# Patient Record
Sex: Female | Born: 1940 | Race: White | Hispanic: No | Smoking: Never smoker
Health system: Southern US, Community
[De-identification: ages and names within clinical notes are randomized; demographics above are authoritative.]

## PROBLEM LIST (undated history)

## (undated) DIAGNOSIS — R42 Dizziness and giddiness: Secondary | ICD-10-CM

## (undated) DIAGNOSIS — E039 Hypothyroidism, unspecified: Secondary | ICD-10-CM

## (undated) DIAGNOSIS — M1990 Unspecified osteoarthritis, unspecified site: Secondary | ICD-10-CM

## (undated) HISTORY — PX: EYE SURGERY: SHX253

## (undated) HISTORY — PX: TUBAL LIGATION: SHX77

## (undated) HISTORY — PX: CARPAL TUNNEL RELEASE: SHX101

## (undated) HISTORY — PX: HIP FRACTURE SURGERY: SHX118

---

## 1997-09-08 ENCOUNTER — Other Ambulatory Visit: Admission: RE | Admit: 1997-09-08 | Discharge: 1997-09-08 | Payer: Self-pay | Admitting: Gynecology

## 1997-09-17 ENCOUNTER — Ambulatory Visit (HOSPITAL_COMMUNITY): Admission: RE | Admit: 1997-09-17 | Discharge: 1997-09-17 | Payer: Self-pay | Admitting: *Deleted

## 1998-02-21 HISTORY — PX: BREAST BIOPSY: SHX20

## 1998-04-16 ENCOUNTER — Ambulatory Visit (HOSPITAL_COMMUNITY): Admission: RE | Admit: 1998-04-16 | Discharge: 1998-04-16 | Payer: Self-pay | Admitting: Diagnostic Radiology

## 1998-04-29 ENCOUNTER — Encounter: Payer: Self-pay | Admitting: Gynecology

## 1998-04-29 ENCOUNTER — Ambulatory Visit (HOSPITAL_COMMUNITY): Admission: RE | Admit: 1998-04-29 | Discharge: 1998-04-29 | Payer: Self-pay | Admitting: Gynecology

## 1998-04-29 HISTORY — PX: BREAST BIOPSY: SHX20

## 1998-11-30 ENCOUNTER — Ambulatory Visit (HOSPITAL_COMMUNITY): Admission: RE | Admit: 1998-11-30 | Discharge: 1998-11-30 | Payer: Self-pay | Admitting: Gynecology

## 1998-11-30 ENCOUNTER — Encounter: Payer: Self-pay | Admitting: Gynecology

## 1999-08-18 ENCOUNTER — Encounter: Payer: Self-pay | Admitting: *Deleted

## 1999-08-18 ENCOUNTER — Ambulatory Visit (HOSPITAL_COMMUNITY): Admission: RE | Admit: 1999-08-18 | Discharge: 1999-08-18 | Payer: Self-pay | Admitting: *Deleted

## 1999-10-18 ENCOUNTER — Ambulatory Visit (HOSPITAL_COMMUNITY): Admission: RE | Admit: 1999-10-18 | Discharge: 1999-10-18 | Payer: Self-pay | Admitting: Neurosurgery

## 2000-01-05 ENCOUNTER — Ambulatory Visit (HOSPITAL_COMMUNITY): Admission: RE | Admit: 2000-01-05 | Discharge: 2000-01-05 | Payer: Self-pay | Admitting: Gynecology

## 2000-01-05 ENCOUNTER — Encounter: Payer: Self-pay | Admitting: Gynecology

## 2001-01-09 ENCOUNTER — Ambulatory Visit (HOSPITAL_COMMUNITY): Admission: RE | Admit: 2001-01-09 | Discharge: 2001-01-09 | Payer: Self-pay | Admitting: Gynecology

## 2001-01-09 ENCOUNTER — Encounter: Payer: Self-pay | Admitting: Gynecology

## 2001-07-31 ENCOUNTER — Other Ambulatory Visit: Admission: RE | Admit: 2001-07-31 | Discharge: 2001-07-31 | Payer: Self-pay | Admitting: Obstetrics & Gynecology

## 2002-01-10 ENCOUNTER — Encounter: Payer: Self-pay | Admitting: Gynecology

## 2002-01-10 ENCOUNTER — Ambulatory Visit (HOSPITAL_COMMUNITY): Admission: RE | Admit: 2002-01-10 | Discharge: 2002-01-10 | Payer: Self-pay | Admitting: Gynecology

## 2002-10-01 ENCOUNTER — Other Ambulatory Visit: Admission: RE | Admit: 2002-10-01 | Discharge: 2002-10-01 | Payer: Self-pay | Admitting: Gynecology

## 2003-01-13 ENCOUNTER — Ambulatory Visit (HOSPITAL_COMMUNITY): Admission: RE | Admit: 2003-01-13 | Discharge: 2003-01-13 | Payer: Self-pay | Admitting: Gynecology

## 2003-12-01 ENCOUNTER — Other Ambulatory Visit: Admission: RE | Admit: 2003-12-01 | Discharge: 2003-12-01 | Payer: Self-pay | Admitting: Gynecology

## 2004-01-14 ENCOUNTER — Ambulatory Visit (HOSPITAL_COMMUNITY): Admission: RE | Admit: 2004-01-14 | Discharge: 2004-01-14 | Payer: Self-pay | Admitting: Gynecology

## 2005-01-20 ENCOUNTER — Other Ambulatory Visit: Admission: RE | Admit: 2005-01-20 | Discharge: 2005-01-20 | Payer: Self-pay | Admitting: Gynecology

## 2005-01-24 ENCOUNTER — Ambulatory Visit (HOSPITAL_COMMUNITY): Admission: RE | Admit: 2005-01-24 | Discharge: 2005-01-24 | Payer: Self-pay | Admitting: Gynecology

## 2006-01-26 ENCOUNTER — Ambulatory Visit (HOSPITAL_COMMUNITY): Admission: RE | Admit: 2006-01-26 | Discharge: 2006-01-26 | Payer: Self-pay | Admitting: Gynecology

## 2007-02-02 ENCOUNTER — Ambulatory Visit (HOSPITAL_COMMUNITY): Admission: RE | Admit: 2007-02-02 | Discharge: 2007-02-02 | Payer: Self-pay | Admitting: Internal Medicine

## 2008-02-06 ENCOUNTER — Ambulatory Visit (HOSPITAL_COMMUNITY): Admission: RE | Admit: 2008-02-06 | Discharge: 2008-02-06 | Payer: Self-pay | Admitting: Internal Medicine

## 2009-03-23 ENCOUNTER — Ambulatory Visit (HOSPITAL_COMMUNITY): Admission: RE | Admit: 2009-03-23 | Discharge: 2009-03-23 | Payer: Self-pay | Admitting: Internal Medicine

## 2010-03-11 ENCOUNTER — Other Ambulatory Visit (HOSPITAL_COMMUNITY): Payer: Self-pay | Admitting: Internal Medicine

## 2010-03-11 DIAGNOSIS — Z1231 Encounter for screening mammogram for malignant neoplasm of breast: Secondary | ICD-10-CM

## 2010-03-11 DIAGNOSIS — Z0000 Encounter for general adult medical examination without abnormal findings: Secondary | ICD-10-CM

## 2010-03-25 ENCOUNTER — Ambulatory Visit (HOSPITAL_COMMUNITY): Payer: Self-pay

## 2010-03-31 ENCOUNTER — Ambulatory Visit (HOSPITAL_COMMUNITY): Admission: RE | Admit: 2010-03-31 | Payer: Self-pay | Source: Home / Self Care | Admitting: Internal Medicine

## 2010-04-01 ENCOUNTER — Ambulatory Visit (HOSPITAL_COMMUNITY): Payer: Self-pay

## 2010-05-26 ENCOUNTER — Ambulatory Visit (HOSPITAL_COMMUNITY)
Admission: RE | Admit: 2010-05-26 | Discharge: 2010-05-26 | Disposition: A | Discharge status: Home / Self Care | Payer: Medicare Other | Source: Ambulatory Visit | Attending: Internal Medicine | Admitting: Internal Medicine

## 2010-05-26 DIAGNOSIS — Z1231 Encounter for screening mammogram for malignant neoplasm of breast: Secondary | ICD-10-CM

## 2010-07-09 NOTE — Op Note (Signed)
. [facility]  Patient:    Jessica [name], Jessica [name]                          MRN: [id number] Proc. Date: 10/18/99 Adm. Date:  [id number] Disc. Date: [id number] Attending:  Donn Pierini                           Operative Report  PREOPERATIVE DIAGNOSIS:  Right carpal tunnel syndrome.  POSTOPERATIVE DIAGNOSIS:  Right carpal tunnel syndrome.  OPERATION PERFORMED:  Right carpal tunnel release.  SURGEON:  Julio Sicks, M.D.  ANESTHESIA:  Regional Bier.  INDICATIONS FOR PROCEDURE:  The patient is a 70 year old female with history of bilateral carpal tunnel syndrome, right greater than left.  EMGs and nerve conduction studies confirmed this. She has a rather impressive amount of motor loss and wasting of her right thenar muscles.  The patient has failed conservative management.  She has been given her options and decided to proceed with surgical decompression by means of a right-sided carpal tunnel release.  DESCRIPTION OF PROCEDURE:  The patient was taken to the operating room and placed on the operating table in supine position.  After adequate level of anesthesia was achieved, the patient was positioned supine with her right arm outstretched. The tourniquet was inflated.  An incision was made along the midpalmar crease just approximately 1 cm distal to the distal wrist fold. This was carried down sharply fascia.  The palmar fascia was divided and the transverse carpal ligament was exposed.  The transverse carpal ligament was then divided exposing the underlying median nerve nerve.  The median nerve was then decompressed distally by further incising the transverse carpal ligament. Fat herniated into the wound signifying good distal decompression.  The proximal of the transverse carpal ligament was cut by first protecting then nerve using a Freer and then incising the ligament overtop the Cisco.  The ligament itself was cut into the distal forearm.  There is no  evidence of any continued compression.  There is no evidence of injury to the nerve itself. The wound was irrigated with antibiotic solution.  The skin was then reapproximated  with 4-0 Vicryl at the deep dermis and 4-0 nylon in interrupted vertical mattress fashion at the surface.  A sterile dressing was applied.  There were no apparent complications. The patient tolerated the procedure well and she returned to the recovery room postoperatively. DD:  10/18/99 TD:  10/18/99 Job: [id number]

## 2011-05-03 ENCOUNTER — Other Ambulatory Visit (HOSPITAL_COMMUNITY): Payer: Self-pay | Admitting: Internal Medicine

## 2011-05-03 DIAGNOSIS — Z1231 Encounter for screening mammogram for malignant neoplasm of breast: Secondary | ICD-10-CM

## 2011-06-01 ENCOUNTER — Ambulatory Visit (HOSPITAL_COMMUNITY)
Admission: RE | Admit: 2011-06-01 | Discharge: 2011-06-01 | Disposition: A | Discharge status: Home / Self Care | Payer: Medicare Other | Source: Ambulatory Visit | Attending: Internal Medicine | Admitting: Internal Medicine

## 2011-06-01 DIAGNOSIS — Z1231 Encounter for screening mammogram for malignant neoplasm of breast: Secondary | ICD-10-CM | POA: Insufficient documentation

## 2011-11-30 ENCOUNTER — Encounter (HOSPITAL_COMMUNITY): Payer: Self-pay | Admitting: Pharmacy Technician

## 2011-12-02 ENCOUNTER — Other Ambulatory Visit: Payer: Self-pay | Admitting: Neurosurgery

## 2011-12-06 ENCOUNTER — Encounter (HOSPITAL_COMMUNITY)
Admission: RE | Admit: 2011-12-06 | Discharge: 2011-12-06 | Disposition: A | Discharge status: Home / Self Care | Payer: Medicare Other | Source: Ambulatory Visit | Attending: Neurosurgery | Admitting: Neurosurgery

## 2011-12-06 ENCOUNTER — Encounter (HOSPITAL_COMMUNITY): Payer: Self-pay

## 2011-12-06 HISTORY — DX: Hypothyroidism, unspecified: E03.9

## 2011-12-06 HISTORY — DX: Unspecified osteoarthritis, unspecified site: M19.90

## 2011-12-06 LAB — CBC WITH DIFFERENTIAL/PLATELET
Basophils Absolute: 0.1 10*3/uL (ref 0.0–0.1)
Basophils Relative: 1 % (ref 0–1)
Eosinophils Absolute: 0.2 10*3/uL (ref 0.0–0.7)
Eosinophils Relative: 2 % (ref 0–5)
HCT: 38.7 % (ref 36.0–46.0)
Hemoglobin: 13.4 g/dL (ref 12.0–15.0)
Lymphs Abs: 2.5 10*3/uL (ref 0.7–4.0)
MCH: 32.2 pg (ref 26.0–34.0)
MCHC: 34.6 g/dL (ref 30.0–36.0)
MCV: 93 fL (ref 78.0–100.0)
Monocytes Absolute: 0.6 10*3/uL (ref 0.1–1.0)
Monocytes Relative: 8 % (ref 3–12)
Neutro Abs: 4.9 10*3/uL (ref 1.7–7.7)
Neutrophils Relative %: 59 % (ref 43–77)
RBC: 4.16 MIL/uL (ref 3.87–5.11)

## 2011-12-06 LAB — TYPE AND SCREEN
ABO/RH(D): O POS
Antibody Screen: NEGATIVE

## 2011-12-06 LAB — SURGICAL PCR SCREEN
MRSA, PCR: NEGATIVE
Staphylococcus aureus: NEGATIVE

## 2011-12-06 NOTE — Pre-Procedure Instructions (Signed)
20 Jessica [name]  12/06/2011   Your procedure is scheduled on:  12-13-2011  Report to [facility] at 5:30 AM.  Call this number if you have problems the morning of surgery: [phone]   Remember:   Do not eat food or drink:After Midnight.      Take these medicines the morning of surgery with A SIP OF WATER: levothyroxine,evista   Do not wear jewelry, make-up or nail polish.  Do not wear lotions, powders, or perfumes. You may wear deodorant.  Do not shave 48 hours prior to surgery.  Do not bring valuables to the hospital.  Contacts, dentures or bridgework may not be worn into surgery.  Leave suitcase in the car. After surgery it may be brought to your room.   For patients admitted to the hospital, checkout time is 11:00 AM the day of discharge.    Special Instructions: Shower using CHG 2 nights before surgery and the night before surgery.  If you shower the day of surgery use CHG.  Use special wash - you have one bottle of CHG for all showers.  You should use approximately 1/3 of the bottle for each shower.     Please read over the following fact sheets that you were given: Pain Booklet, Coughing and Deep Breathing, Blood Transfusion Information, MRSA Information and Surgical Site Infection Prevention

## 2011-12-12 MED ORDER — CEFAZOLIN SODIUM-DEXTROSE 2-3 GM-% IV SOLR
2.0000 g | INTRAVENOUS | Status: AC
  Administered 2011-12-13: 2 g via INTRAVENOUS
  Filled 2011-12-12: qty 50

## 2011-12-13 ENCOUNTER — Encounter (HOSPITAL_COMMUNITY): Payer: Self-pay | Admitting: Anesthesiology

## 2011-12-13 ENCOUNTER — Ambulatory Visit (HOSPITAL_COMMUNITY): Payer: Medicare Other

## 2011-12-13 ENCOUNTER — Inpatient Hospital Stay (HOSPITAL_COMMUNITY)
Admission: RE | Admit: 2011-12-13 | Discharge: 2011-12-14 | DRG: 473 | Disposition: A | Discharge status: Home / Self Care | Payer: Medicare Other | Source: Ambulatory Visit | Attending: Neurosurgery | Admitting: Neurosurgery

## 2011-12-13 ENCOUNTER — Encounter (HOSPITAL_COMMUNITY)
Admission: RE | Disposition: A | Discharge status: Home / Self Care | Payer: Self-pay | Source: Ambulatory Visit | Attending: Neurosurgery

## 2011-12-13 ENCOUNTER — Ambulatory Visit (HOSPITAL_COMMUNITY): Payer: Medicare Other | Admitting: Anesthesiology

## 2011-12-13 ENCOUNTER — Encounter (HOSPITAL_COMMUNITY): Payer: Self-pay | Admitting: Neurosurgery

## 2011-12-13 DIAGNOSIS — M47812 Spondylosis without myelopathy or radiculopathy, cervical region: Principal | ICD-10-CM | POA: Diagnosis present

## 2011-12-13 DIAGNOSIS — M4802 Spinal stenosis, cervical region: Secondary | ICD-10-CM

## 2011-12-13 DIAGNOSIS — M129 Arthropathy, unspecified: Secondary | ICD-10-CM | POA: Diagnosis present

## 2011-12-13 DIAGNOSIS — Z79899 Other long term (current) drug therapy: Secondary | ICD-10-CM

## 2011-12-13 DIAGNOSIS — M5020 Other cervical disc displacement, unspecified cervical region: Secondary | ICD-10-CM | POA: Diagnosis present

## 2011-12-13 DIAGNOSIS — E039 Hypothyroidism, unspecified: Secondary | ICD-10-CM | POA: Diagnosis present

## 2011-12-13 HISTORY — DX: Spinal stenosis, cervical region: M48.02

## 2011-12-13 HISTORY — PX: ANTERIOR CERVICAL DECOMP/DISCECTOMY FUSION: SHX1161

## 2011-12-13 SURGERY — ANTERIOR CERVICAL DECOMPRESSION/DISCECTOMY FUSION 3 LEVELS
Anesthesia: General | Site: Neck | Wound class: Clean

## 2011-12-13 MED ORDER — NIACIN ER 500 MG PO CPCR
500.0000 mg | ORAL_CAPSULE | Freq: Every day | ORAL | Status: DC
  Filled 2011-12-13 (×2): qty 1

## 2011-12-13 MED ORDER — HYDROMORPHONE HCL PF 1 MG/ML IJ SOLN
0.2500 mg | INTRAMUSCULAR | Status: DC | PRN
  Administered 2011-12-13: 0.5 mg via INTRAVENOUS
  Administered 2011-12-13 (×2): 0.25 mg via INTRAVENOUS

## 2011-12-13 MED ORDER — THROMBIN 20000 UNITS EX KIT
PACK | CUTANEOUS | Status: DC | PRN
  Administered 2011-12-13: 20000 [IU] via TOPICAL

## 2011-12-13 MED ORDER — LIDOCAINE HCL (CARDIAC) 20 MG/ML IV SOLN
INTRAVENOUS | Status: DC | PRN
  Administered 2011-12-13: 100 mg via INTRAVENOUS

## 2011-12-13 MED ORDER — DEXAMETHASONE SODIUM PHOSPHATE 10 MG/ML IJ SOLN
INTRAMUSCULAR | Status: AC
  Administered 2011-12-13: 10 mg via INTRAVENOUS
  Filled 2011-12-13: qty 1

## 2011-12-13 MED ORDER — RALOXIFENE HCL 60 MG PO TABS
60.0000 mg | ORAL_TABLET | Freq: Every day | ORAL | Status: DC
  Administered 2011-12-14: 60 mg via ORAL
  Filled 2011-12-13 (×2): qty 1

## 2011-12-13 MED ORDER — SENNA 8.6 MG PO TABS
1.0000 | ORAL_TABLET | Freq: Two times a day (BID) | ORAL | Status: DC
  Filled 2011-12-13 (×3): qty 1

## 2011-12-13 MED ORDER — ROCURONIUM BROMIDE 100 MG/10ML IV SOLN
INTRAVENOUS | Status: DC | PRN
  Administered 2011-12-13: 10 mg via INTRAVENOUS
  Administered 2011-12-13: 50 mg via INTRAVENOUS
  Administered 2011-12-13 (×2): 10 mg via INTRAVENOUS

## 2011-12-13 MED ORDER — FENTANYL CITRATE 0.05 MG/ML IJ SOLN
INTRAMUSCULAR | Status: DC | PRN
  Administered 2011-12-13: 50 ug via INTRAVENOUS
  Administered 2011-12-13: 100 ug via INTRAVENOUS
  Administered 2011-12-13 (×3): 50 ug via INTRAVENOUS
  Administered 2011-12-13: 100 ug via INTRAVENOUS
  Administered 2011-12-13 (×4): 50 ug via INTRAVENOUS

## 2011-12-13 MED ORDER — OXYCODONE-ACETAMINOPHEN 5-325 MG PO TABS
1.0000 | ORAL_TABLET | ORAL | Status: DC | PRN
  Administered 2011-12-13 – 2011-12-14 (×2): 1 via ORAL
  Filled 2011-12-13 (×2): qty 1

## 2011-12-13 MED ORDER — SODIUM CHLORIDE 0.9 % IR SOLN
Status: DC | PRN
  Administered 2011-12-13: 09:00:00

## 2011-12-13 MED ORDER — SODIUM CHLORIDE 0.9 % IJ SOLN
3.0000 mL | Freq: Two times a day (BID) | INTRAMUSCULAR | Status: DC
  Administered 2011-12-13: 3 mL via INTRAVENOUS

## 2011-12-13 MED ORDER — MENTHOL 3 MG MT LOZG: 1.0000 | LOZENGE | OROMUCOSAL | Status: DC | PRN

## 2011-12-13 MED ORDER — METOCLOPRAMIDE HCL 5 MG/ML IJ SOLN: 10.0000 mg | Freq: Once | INTRAMUSCULAR | Status: DC | PRN

## 2011-12-13 MED ORDER — NIACIN ER 500 MG PO TBCR
500.0000 mg | EXTENDED_RELEASE_TABLET | Freq: Every day | ORAL | Status: DC
  Filled 2011-12-13: qty 1

## 2011-12-13 MED ORDER — LEVOTHYROXINE SODIUM 100 MCG PO TABS
100.0000 ug | ORAL_TABLET | Freq: Every day | ORAL | Status: DC
  Administered 2011-12-14: 100 ug via ORAL
  Filled 2011-12-13 (×2): qty 1

## 2011-12-13 MED ORDER — ACETAMINOPHEN 650 MG RE SUPP: 650.0000 mg | RECTAL | Status: DC | PRN

## 2011-12-13 MED ORDER — ALUM & MAG HYDROXIDE-SIMETH 200-200-20 MG/5ML PO SUSP: 30.0000 mL | Freq: Four times a day (QID) | ORAL | Status: DC | PRN

## 2011-12-13 MED ORDER — CEFAZOLIN SODIUM 1-5 GM-% IV SOLN
1.0000 g | Freq: Three times a day (TID) | INTRAVENOUS | Status: AC
  Administered 2011-12-13 (×2): 1 g via INTRAVENOUS
  Filled 2011-12-13 (×2): qty 50

## 2011-12-13 MED ORDER — BISACODYL 10 MG RE SUPP: 10.0000 mg | Freq: Every day | RECTAL | Status: DC | PRN

## 2011-12-13 MED ORDER — THROMBIN 5000 UNITS EX SOLR
OROMUCOSAL | Status: DC | PRN
  Administered 2011-12-13: 10:00:00 via TOPICAL

## 2011-12-13 MED ORDER — CYCLOBENZAPRINE HCL 10 MG PO TABS: 10.0000 mg | ORAL_TABLET | Freq: Three times a day (TID) | ORAL | Status: DC | PRN

## 2011-12-13 MED ORDER — HEMOSTATIC AGENTS (NO CHARGE) OPTIME
TOPICAL | Status: DC | PRN
  Administered 2011-12-13: 1 via TOPICAL

## 2011-12-13 MED ORDER — DEXAMETHASONE SODIUM PHOSPHATE 10 MG/ML IJ SOLN: 10.0000 mg | INTRAMUSCULAR | Status: DC

## 2011-12-13 MED ORDER — PHENOL 1.4 % MT LIQD: 1.0000 | OROMUCOSAL | Status: DC | PRN

## 2011-12-13 MED ORDER — ONDANSETRON HCL 4 MG/2ML IJ SOLN
INTRAMUSCULAR | Status: DC | PRN
  Administered 2011-12-13: 4 mg via INTRAVENOUS

## 2011-12-13 MED ORDER — POLYETHYLENE GLYCOL 3350 17 G PO PACK
17.0000 g | PACK | Freq: Every day | ORAL | Status: DC | PRN
  Filled 2011-12-13: qty 1

## 2011-12-13 MED ORDER — ONDANSETRON HCL 4 MG/2ML IJ SOLN: 4.0000 mg | INTRAMUSCULAR | Status: DC | PRN

## 2011-12-13 MED ORDER — LACTATED RINGERS IV SOLN
INTRAVENOUS | Status: DC | PRN
  Administered 2011-12-13 (×2): via INTRAVENOUS

## 2011-12-13 MED ORDER — HYDROMORPHONE HCL PF 1 MG/ML IJ SOLN
INTRAMUSCULAR | Status: AC
  Administered 2011-12-13: 0.25 mg via INTRAVENOUS
  Filled 2011-12-13: qty 1

## 2011-12-13 MED ORDER — ZOLPIDEM TARTRATE 5 MG PO TABS: 5.0000 mg | ORAL_TABLET | Freq: Every evening | ORAL | Status: DC | PRN

## 2011-12-13 MED ORDER — MIDAZOLAM HCL 5 MG/5ML IJ SOLN
INTRAMUSCULAR | Status: DC | PRN
  Administered 2011-12-13: 2 mg via INTRAVENOUS

## 2011-12-13 MED ORDER — HYDROCODONE-ACETAMINOPHEN 5-325 MG PO TABS: 1.0000 | ORAL_TABLET | ORAL | Status: DC | PRN

## 2011-12-13 MED ORDER — SODIUM CHLORIDE 0.9 % IV SOLN
INTRAVENOUS | Status: AC
  Filled 2011-12-13: qty 500

## 2011-12-13 MED ORDER — GLYCOPYRROLATE 0.2 MG/ML IJ SOLN
INTRAMUSCULAR | Status: DC | PRN
  Administered 2011-12-13: 0.6 mg via INTRAVENOUS

## 2011-12-13 MED ORDER — OXYCODONE HCL 5 MG/5ML PO SOLN: 5.0000 mg | Freq: Once | ORAL | Status: DC | PRN

## 2011-12-13 MED ORDER — 0.9 % SODIUM CHLORIDE (POUR BTL) OPTIME
TOPICAL | Status: DC | PRN
  Administered 2011-12-13: 1000 mL

## 2011-12-13 MED ORDER — OXYCODONE HCL 5 MG PO TABS: 5.0000 mg | ORAL_TABLET | Freq: Once | ORAL | Status: DC | PRN

## 2011-12-13 MED ORDER — SIMVASTATIN 10 MG PO TABS
10.0000 mg | ORAL_TABLET | Freq: Every day | ORAL | Status: DC
  Filled 2011-12-13 (×2): qty 1

## 2011-12-13 MED ORDER — SODIUM CHLORIDE 0.9 % IJ SOLN: 3.0000 mL | INTRAMUSCULAR | Status: DC | PRN

## 2011-12-13 MED ORDER — BACITRACIN 50000 UNITS IM SOLR
INTRAMUSCULAR | Status: AC
  Filled 2011-12-13: qty 1

## 2011-12-13 MED ORDER — ACETAMINOPHEN 325 MG PO TABS: 650.0000 mg | ORAL_TABLET | ORAL | Status: DC | PRN

## 2011-12-13 MED ORDER — SODIUM CHLORIDE 0.9 % IV SOLN: 250.0000 mL | INTRAVENOUS | Status: DC

## 2011-12-13 MED ORDER — PROPOFOL 10 MG/ML IV BOLUS
INTRAVENOUS | Status: DC | PRN
  Administered 2011-12-13: 200 mg via INTRAVENOUS

## 2011-12-13 MED ORDER — HYDROMORPHONE HCL PF 1 MG/ML IJ SOLN: 0.5000 mg | INTRAMUSCULAR | Status: DC | PRN

## 2011-12-13 MED ORDER — ARTIFICIAL TEARS OP OINT
TOPICAL_OINTMENT | OPHTHALMIC | Status: DC | PRN
  Administered 2011-12-13: 1 via OPHTHALMIC

## 2011-12-13 MED ORDER — NEOSTIGMINE METHYLSULFATE 1 MG/ML IJ SOLN
INTRAMUSCULAR | Status: DC | PRN
  Administered 2011-12-13: 4 mg via INTRAVENOUS

## 2011-12-13 SURGICAL SUPPLY — 57 items
APL SKNCLS STERI-STRIP NONHPOA (GAUZE/BANDAGES/DRESSINGS) ×1
BAG DECANTER FOR FLEXI CONT (MISCELLANEOUS) ×2 IMPLANT
BENZOIN TINCTURE PRP APPL 2/3 (GAUZE/BANDAGES/DRESSINGS) ×2 IMPLANT
BRUSH SCRUB EZ PLAIN DRY (MISCELLANEOUS) ×2 IMPLANT
BUR MATCHSTICK NEURO 3.0 LAGG (BURR) ×2 IMPLANT
CANISTER SUCTION 2500CC (MISCELLANEOUS) ×2 IMPLANT
CLOTH BEACON ORANGE TIMEOUT ST (SAFETY) ×2 IMPLANT
CONT SPEC 4OZ CLIKSEAL STRL BL (MISCELLANEOUS) ×2 IMPLANT
DRAPE C-ARM 42X72 X-RAY (DRAPES) ×4 IMPLANT
DRAPE LAPAROTOMY 100X72 PEDS (DRAPES) ×2 IMPLANT
DRAPE MICROSCOPE ZEISS OPMI (DRAPES) ×2 IMPLANT
DRAPE POUCH INSTRU U-SHP 10X18 (DRAPES) ×2 IMPLANT
DRILL BIT (BIT) ×1 IMPLANT
ELECT COATED BLADE 2.86 ST (ELECTRODE) ×2 IMPLANT
ELECT REM PT RETURN 9FT ADLT (ELECTROSURGICAL) ×2
ELECTRODE REM PT RTRN 9FT ADLT (ELECTROSURGICAL) ×1 IMPLANT
EVACUATOR 1/8 PVC DRAIN (DRAIN) ×1 IMPLANT
GAUZE SPONGE 4X4 16PLY XRAY LF (GAUZE/BANDAGES/DRESSINGS) ×1 IMPLANT
GLOVE BIOGEL PI IND STRL 7.0 (GLOVE) IMPLANT
GLOVE BIOGEL PI INDICATOR 7.0 (GLOVE) ×2
GLOVE ECLIPSE 8.5 STRL (GLOVE) ×2 IMPLANT
GLOVE EXAM NITRILE LRG STRL (GLOVE) IMPLANT
GLOVE EXAM NITRILE MD LF STRL (GLOVE) ×1 IMPLANT
GLOVE EXAM NITRILE XL STR (GLOVE) IMPLANT
GLOVE EXAM NITRILE XS STR PU (GLOVE) IMPLANT
GLOVE SS BIOGEL STRL SZ 6.5 (GLOVE) IMPLANT
GLOVE SUPERSENSE BIOGEL SZ 6.5 (GLOVE) ×2
GOWN BRE IMP SLV AUR LG STRL (GOWN DISPOSABLE) ×2 IMPLANT
GOWN BRE IMP SLV AUR XL STRL (GOWN DISPOSABLE) ×1 IMPLANT
GOWN STRL REIN 2XL LVL4 (GOWN DISPOSABLE) IMPLANT
HEAD HALTER (SOFTGOODS) ×2 IMPLANT
HEMOSTAT POWDER KIT SURGIFOAM (HEMOSTASIS) ×1 IMPLANT
KIT BASIN OR (CUSTOM PROCEDURE TRAY) ×2 IMPLANT
KIT ROOM TURNOVER OR (KITS) ×2 IMPLANT
NDL SPNL 20GX3.5 QUINCKE YW (NEEDLE) ×1 IMPLANT
NEEDLE SPNL 20GX3.5 QUINCKE YW (NEEDLE) IMPLANT
NS IRRIG 1000ML POUR BTL (IV SOLUTION) ×2 IMPLANT
PACK LAMINECTOMY NEURO (CUSTOM PROCEDURE TRAY) ×2 IMPLANT
PAD ARMBOARD 7.5X6 YLW CONV (MISCELLANEOUS) ×6 IMPLANT
PLATE 3 57.5XLCK NS SPNE CVD (Plate) IMPLANT
PLATE 3 ATLANTIS TRANS (Plate) ×2 IMPLANT
RUBBERBAND STERILE (MISCELLANEOUS) ×4 IMPLANT
SCREW ST FIX 4 ATL 3120213 (Screw) ×8 IMPLANT
SPACER BONE CORNERSTONE 5X14 (Orthopedic Implant) ×1 IMPLANT
SPACER BONE CORNERSTONE 6X14 (Orthopedic Implant) ×2 IMPLANT
SPONGE GAUZE 4X4 12PLY (GAUZE/BANDAGES/DRESSINGS) ×2 IMPLANT
SPONGE INTESTINAL PEANUT (DISPOSABLE) ×2 IMPLANT
SPONGE SURGIFOAM ABS GEL 100 (HEMOSTASIS) ×2 IMPLANT
STRIP CLOSURE SKIN 1/2X4 (GAUZE/BANDAGES/DRESSINGS) ×2 IMPLANT
SUT PDS AB 5-0 P3 18 (SUTURE) ×2 IMPLANT
SUT VIC AB 3-0 SH 8-18 (SUTURE) ×2 IMPLANT
SYR 20ML ECCENTRIC (SYRINGE) ×2 IMPLANT
TAPE CLOTH 4X10 WHT NS (GAUZE/BANDAGES/DRESSINGS) ×2 IMPLANT
TAPE CLOTH SURG 4X10 WHT LF (GAUZE/BANDAGES/DRESSINGS) ×1 IMPLANT
TOWEL OR 17X24 6PK STRL BLUE (TOWEL DISPOSABLE) ×2 IMPLANT
TOWEL OR 17X26 10 PK STRL BLUE (TOWEL DISPOSABLE) ×2 IMPLANT
WATER STERILE IRR 1000ML POUR (IV SOLUTION) ×2 IMPLANT

## 2011-12-13 NOTE — Anesthesia Procedure Notes (Signed)
Procedure Name: Intubation Date/Time: 12/13/2011 8:00 AM Performed by: Jefm Miles E Pre-anesthesia Checklist: Patient identified, Timeout performed, Emergency Drugs available, Suction available and Patient being monitored Patient Re-evaluated:Patient Re-evaluated prior to inductionOxygen Delivery Method: Circle system utilized Preoxygenation: Pre-oxygenation with 100% oxygen Intubation Type: IV induction Ventilation: Mask ventilation without difficulty Laryngoscope Size: Mac and 3 Grade View: Grade I Tube type: Oral Tube size: 7.0 mm Number of attempts: 1 Airway Equipment and Method: Stylet Placement Confirmation: ETT inserted through vocal cords under direct vision,  breath sounds checked- equal and bilateral and positive ETCO2 Secured at: 21 cm Tube secured with: Tape Dental Injury: Teeth and Oropharynx as per pre-operative assessment

## 2011-12-13 NOTE — Anesthesia Preprocedure Evaluation (Addendum)
Anesthesia Evaluation  Patient identified by MRN, date of birth, ID band Patient awake    Reviewed: Allergy & Precautions, H&P , NPO status , Patient's Chart, lab work & pertinent test results, reviewed documented beta blocker date and time   Airway Mallampati: II TM Distance: >3 FB Neck ROM: limited    Dental  (+) Teeth Intact and Dental Advisory Given   Pulmonary neg pulmonary ROS,  breath sounds clear to auscultation        Cardiovascular negative cardio ROS  Rhythm:regular     Neuro/Psych negative neurological ROS  negative psych ROS   GI/Hepatic negative GI ROS, Neg liver ROS,   Endo/Other  Hypothyroidism   Renal/GU negative Renal ROS  negative genitourinary   Musculoskeletal   Abdominal   Peds  Hematology negative hematology ROS (+)   Anesthesia Other Findings See surgeon's H&P   Reproductive/Obstetrics negative OB ROS                          Anesthesia Physical Anesthesia Plan  ASA: II  Anesthesia Plan: General   Post-op Pain Management:    Induction: Intravenous  Airway Management Planned: Oral ETT  Additional Equipment:   Intra-op Plan:   Post-operative Plan: Extubation in OR  Informed Consent: I have reviewed the patients History and Physical, chart, labs and discussed the procedure including the risks, benefits and alternatives for the proposed anesthesia with the patient or authorized representative who has indicated his/her understanding and acceptance.   Dental Advisory Given  Plan Discussed with: CRNA and Surgeon  Anesthesia Plan Comments:         Anesthesia Quick Evaluation

## 2011-12-13 NOTE — H&P (Signed)
Jessica [name] is an 71 y.o. female.   Chief Complaint: Neck and arm pain HPI: 71 year old female with worsening neck pain with radiation to her upper extremities left greater than right. Workup demonstrates evidence of severe spondylosis with stenosis at C5-6 and C6-7 as well as a disc herniation at C7-T1. Patient's failed efforts at conservative management. She presents now for three-level anterior cervical decompression and fusion.  Past Medical History  Diagnosis Date  . Hypothyroidism   . Arthritis     Past Surgical History  Procedure Date  . Carpal tunnel release     right  . Eye surgery     cataract bilateral    No family history on file. Social History:  reports that she has never smoked. She does not have any smokeless tobacco history on file. She reports that she drinks alcohol. She reports that she does not use illicit drugs.  Allergies: No Known Allergies  Medications Prior to Admission  Medication Sig Dispense Refill  . acetaminophen (TYLENOL) 500 MG tablet Take 500 mg by mouth once.      . ergocalciferol (VITAMIN D2) 50000 UNITS capsule Take 50,000 Units by mouth every 30 (thirty) days. Due on 10/15      . fish oil-omega-3 fatty acids 1000 MG capsule Take 1 g by mouth daily.      Marland Kitchen ibuprofen (ADVIL,MOTRIN) 200 MG tablet Take 400 mg by mouth every 6 (six) hours as needed. For pain      . levothyroxine (SYNTHROID, LEVOTHROID) 100 MCG tablet Take 100 mcg by mouth daily.      . niacin (SLO-NIACIN) 500 MG tablet Take 500 mg by mouth at bedtime.      . pravastatin (PRAVACHOL) 40 MG tablet Take 20 mg by mouth daily.      . raloxifene (EVISTA) 60 MG tablet Take 60 mg by mouth daily.        No results found for this or any previous visit (from the past 48 hour(s)). No results found.  Review of Systems  Constitutional: Negative.   HENT: Negative.   Eyes: Negative.   Respiratory: Negative.   Cardiovascular: Negative.   Gastrointestinal: Negative.   Genitourinary:  Negative.   Musculoskeletal: Negative.   Skin: Negative.   Neurological: Negative.   Endo/Heme/Allergies: Negative.   Psychiatric/Behavioral: Negative.     Blood pressure 126/73, pulse 69, temperature 98.2 F (36.8 C), temperature source Oral, resp. rate 18, SpO2 98.00%. Physical Exam  Constitutional: She is oriented to person, place, and time. She appears well-developed and well-nourished.  HENT:  Head: Normocephalic and atraumatic.  Right Ear: External ear normal.  Left Ear: External ear normal.  Nose: Nose normal.  Mouth/Throat: Oropharynx is clear and moist. No oropharyngeal exudate.  Eyes: Conjunctivae normal and EOM are normal. Pupils are equal, round, and reactive to light. Right eye exhibits no discharge. Left eye exhibits no discharge.  Neck: No JVD present. No tracheal deviation present. No thyromegaly present.       Decreased range of motion with moderate stiffness.  Cardiovascular: Normal rate, regular rhythm, normal heart sounds and intact distal pulses.  Exam reveals no friction rub.   No murmur heard. Respiratory: Effort normal and breath sounds normal. No stridor. No respiratory distress. She has no wheezes.  GI: Soft. Bowel sounds are normal. She exhibits no distension. There is no tenderness.  Musculoskeletal: Normal range of motion. She exhibits no edema and no tenderness.  Neurological: She is alert and oriented to person, place, and time. She  has normal reflexes. No cranial nerve deficit. Coordination normal.  Skin: Skin is warm and dry. No rash noted. No erythema. No pallor.  Psychiatric: She has a normal mood and affect. Her behavior is normal. Judgment and thought content normal.     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 C7-T1 herniated nucleus pulposus with radiculopathy. Plan see C5-6 C6-7 C7-T1 anterior cervical discectomy and fusion with allograft and anterior plating. Risks and benefits been explained. Patient wishes to  proceed.  Rainer Mounce A 12/13/2011, 7:48 AM

## 2011-12-13 NOTE — Op Note (Signed)
Date of procedure: 12/13/2011  Date of dictation: Same  Service: Neurosurgery  Preoperative diagnosis: C5-6, C6-7 spondylosis with stenosis. Right C7-T1 herniated nucleus pulposus with radiculopathy  Postoperative diagnosis: Same  Procedure Name: C5-6, C6-7, C7-T1 anterior cervical discectomy and fusion with allograft and anterior plating  Surgeon:King Pinzon A.Parrish Bonn, M.D.  Asst. Surgeon: Gerlene Fee  Anesthesia: General  Indication: 71 year old female with severe neck pain with upper term he symptoms right greater than left and significant weakness and loss of dexterity in her right hand consistent with a right-sided C8 root off her workup demonstrates evidence of marked spondylosis with stenosis at C5-6 and C6-7 with an acute right-sided C7-T1 disc herniation. Patient presents now for three-level anterior cervical decompression infusion in hopes of improving her symptoms.  Operative note: After induction of anesthesia, patient positioned supine with Extended period halter traction was used.Marland Kitchen Anterior neck was prepped and draped sterilely. A linear skin incision was made overlying the C6-7 interspace. This carried down sharply to the platysma. Dissection then proceeded along the medial border of the sternoclavicular muscle and carotid sheath. Trachea and esophagus were mobilized retracted towards the right. Prevertebral fascia stripped within her spinal column. Longus: Was elevated bilaterally symmetric. Deep self placed intraoperative fluoroscopy was used levels were confirmed. Anterior osteophytes removed using high-speed drill and Leksell rongeur. The spaces at all 3 levels and a size 15 blade root and affect what the space then achieved using pituitary rongeurs for tobacco progress Kerrison or his high-speed drill per almost the disc removed gentle posterior annulus. Marked and brought to the septum remainder of the discectomy. Remaining aspects of annulus ossifies removed using high-speed drill down  to the level of the posterior longitudinal. Posterior lateral is an elevated and resected piecemeal fashion. Underlying thecal sac and the Y. center decompression performed and the bodies of C5 and C6. Decompression appeared pristine nature of foramen. Wide anterior foraminotomies were then performed of course exiting C6 nerve roots bilaterally. This point a very thorough decompression achieved bilaterally. There is no his injury to thecal sac and nerve roots. Procedure then repeated at C6-7 and C7-T1 with the finding of a large amount of free disc herniation on the right at C7-T1. All 3 disc spaces with evidence irrigated out like solution. Gelfoam was placed topically for hemostasis. Cornerstone allograft wedges and packed into place and recessed off the 1 mm the anterior cortical margin at C5-6 C6-7 and C7-T1. This 57 mm Atlantis anterior cervical plate was then placed over the  5671 levels. This infection or fluoroscopic guidance using 13 motor fixed angle screws to each and L4 levels. All 8 screws and final tightening be solidly within bone. Locking screws engaged at all 4 levels. Final images revealed position the rest and hardware at the proper upper level with normal lamina spine. Hemostasis assured with the above work R. Wound is irrigated one final time. Gelfoam was placed topically for hemostasis. Wound is then irrigated one final time. A medium new no drain was left in the prevertebral space. Wounds and closed in layers. Steri-Strips triggers were applied. There were no apparent locations. Patient tolerated well and returned to recovery postop.

## 2011-12-13 NOTE — Progress Notes (Signed)
UR COMPLETED  

## 2011-12-13 NOTE — Brief Op Note (Signed)
12/13/2011  10:19 AM  PATIENT:  Jessica [name]  71 y.o. female  PRE-OPERATIVE DIAGNOSIS:  Cervical stenosis  POST-OPERATIVE DIAGNOSIS:  Cervical stenosis  PROCEDURE:  Procedure(s) (LRB) with comments: ANTERIOR CERVICAL DECOMPRESSION/DISCECTOMY FUSION 3 LEVELS (N/A) - Cervical Five-Six Cervical Six-Seven Cervical Seven-Thoracic One Anterior cervical decompression/diskectomy/fusion/plating/allograft  SURGEON:  Surgeon(s) and Role:    * Temple Pacini, MD - Primary    * Reinaldo Meeker, MD - Assisting  PHYSICIAN ASSISTANT:   ASSISTANTS:    ANESTHESIA:   general  EBL:  Total I/O In: 1000 [I.V.:1000] Out: 250 [Blood:250]  BLOOD ADMINISTERED:none  DRAINS: (Medium) Hemovact drain(s) in the Prevertebral space with  Suction Open   LOCAL MEDICATIONS USED:  NONE  SPECIMEN:  No Specimen  DISPOSITION OF SPECIMEN:  N/A  COUNTS:  YES  TOURNIQUET:  * No tourniquets in log *  DICTATION: .Dragon Dictation  PLAN OF CARE: Admit to inpatient   PATIENT DISPOSITION:  PACU - hemodynamically stable.   Delay start of Pharmacological VTE agent (>24hrs) due to surgical blood loss or risk of bleeding: yes

## 2011-12-13 NOTE — Transfer of Care (Signed)
Immediate Anesthesia Transfer of Care Note  Patient: Jessica [name]  Procedure(s) Performed: Procedure(s) (LRB) with comments: ANTERIOR CERVICAL DECOMPRESSION/DISCECTOMY FUSION 3 LEVELS (N/A) - Cervical Five-Six Cervical Six-Seven Cervical Seven-Thoracic One Anterior cervical decompression/diskectomy/fusion/plating/allograft  Patient Location: PACU  Anesthesia Type: General  Level of Consciousness: awake, alert  and oriented  Airway & Oxygen Therapy: Patient Spontanous Breathing and Patient connected to nasal cannula oxygen  Post-op Assessment: Report given to PACU RN and Patient moving all extremities X 4  Post vital signs: Reviewed and stable  Complications: No apparent anesthesia complications

## 2011-12-13 NOTE — Preoperative (Signed)
Beta Blockers   Reason not to administer Beta Blockers:Not Applicable 

## 2011-12-13 NOTE — Anesthesia Postprocedure Evaluation (Signed)
Anesthesia Post Note  Patient: Jessica [name]  Procedure(s) Performed: Procedure(s) (LRB): ANTERIOR CERVICAL DECOMPRESSION/DISCECTOMY FUSION 3 LEVELS (N/A)  Anesthesia type: general  Patient location: PACU  Post pain: Pain level controlled  Post assessment: Patient's Cardiovascular Status Stable  Last Vitals:  Filed Vitals:   12/13/11 1200  BP: 135/75  Pulse: 79  Temp:   Resp: 22    Post vital signs: Reviewed and stable  Level of consciousness: sedated  Complications: No apparent anesthesia complications

## 2011-12-14 MED ORDER — HYDROCODONE-ACETAMINOPHEN 5-325 MG PO TABS: 1.0000 | ORAL_TABLET | ORAL | Status: DC | PRN

## 2011-12-14 MED ORDER — CYCLOBENZAPRINE HCL 10 MG PO TABS: 10.0000 mg | ORAL_TABLET | Freq: Three times a day (TID) | ORAL | Status: DC | PRN

## 2011-12-14 NOTE — Progress Notes (Signed)
Pt doing well. Pt given D/C instructions with Rx's, verbal understanding given. Pt D/C'd home via wheelchair with family per MD order. Rema Fendt, RN

## 2011-12-14 NOTE — Discharge Summary (Signed)
Physician Discharge Summary  Patient ID: Jessica [name] MRN: [id number] DOB/AGE: [birth year] 71 y.o.  Admit date: 12/13/2011 Discharge date: 12/14/2011  Admission Diagnoses:  Discharge Diagnoses:  Principal Problem:  *Cervical spinal stenosis   Discharged Condition: good  Hospital Course: Patient admitted to the hospital where she underwent uncomplicated three-level anterior cervical decompression and fusion. Postoperative she is done quite well. Preoperative neck and upper cavity pain much improved. Strength cessation intact. Wound healing well. Ready for discharge home.  Consults:   Significant Diagnostic Studies:   Treatments:   Discharge Exam: Blood pressure 136/72, pulse 76, temperature 99.4 F (37.4 C), temperature source Oral, resp. rate 16, SpO2 94.00%. Awake and alert oriented and appropriate. Cranial nerve function is intact. Motor and sensory function extremities is normal. Wound clean dry and intact. Chest abdomen benign.  Disposition:      Medication List     As of 12/14/2011  7:49 AM    TAKE these medications         acetaminophen 500 MG tablet   Commonly known as: TYLENOL   Take 500 mg by mouth once.      cyclobenzaprine 10 MG tablet   Commonly known as: FLEXERIL   Take 1 tablet (10 mg total) by mouth 3 (three) times daily as needed for muscle spasms.      ergocalciferol 50000 UNITS capsule   Commonly known as: VITAMIN D2   Take 50,000 Units by mouth every 30 (thirty) days. Due on 10/15      fish oil-omega-3 fatty acids 1000 MG capsule   Take 1 g by mouth daily.      HYDROcodone-acetaminophen 5-325 MG per tablet   Commonly known as: NORCO/VICODIN   Take 1-2 tablets by mouth every 4 (four) hours as needed.      ibuprofen 200 MG tablet   Commonly known as: ADVIL,MOTRIN   Take 400 mg by mouth every 6 (six) hours as needed. For pain      levothyroxine 100 MCG tablet   Commonly known as: SYNTHROID, LEVOTHROID   Take 100 mcg by mouth daily.      niacin 500 MG tablet   Commonly known as: SLO-NIACIN   Take 500 mg by mouth at bedtime.      pravastatin 40 MG tablet   Commonly known as: PRAVACHOL   Take 20 mg by mouth daily.      raloxifene 60 MG tablet   Commonly known as: EVISTA   Take 60 mg by mouth daily.           Follow-up Information    Follow up with Carylon Tamburro A, MD. Call in 1 week. (Ask for [name])    Contact information:   [address] [place] [address] [phone]          Signed: Julio Sicks A 12/14/2011, 7:49 AM

## 2011-12-15 ENCOUNTER — Encounter (HOSPITAL_COMMUNITY): Payer: Self-pay | Admitting: Neurosurgery

## 2012-05-22 ENCOUNTER — Other Ambulatory Visit (HOSPITAL_COMMUNITY): Payer: Self-pay | Admitting: Internal Medicine

## 2012-05-22 DIAGNOSIS — Z1231 Encounter for screening mammogram for malignant neoplasm of breast: Secondary | ICD-10-CM

## 2012-06-19 ENCOUNTER — Ambulatory Visit (HOSPITAL_COMMUNITY): Payer: Medicare Other

## 2012-06-28 ENCOUNTER — Ambulatory Visit (HOSPITAL_COMMUNITY)
Admission: RE | Admit: 2012-06-28 | Discharge: 2012-06-28 | Disposition: A | Discharge status: Home / Self Care | Payer: Medicare Other | Source: Ambulatory Visit | Attending: Internal Medicine | Admitting: Internal Medicine

## 2012-06-28 DIAGNOSIS — Z1231 Encounter for screening mammogram for malignant neoplasm of breast: Secondary | ICD-10-CM | POA: Insufficient documentation

## 2013-03-29 DIAGNOSIS — M47817 Spondylosis without myelopathy or radiculopathy, lumbosacral region: Secondary | ICD-10-CM

## 2013-03-29 HISTORY — DX: Spondylosis without myelopathy or radiculopathy, lumbosacral region: M47.817

## 2013-04-11 DIAGNOSIS — M5126 Other intervertebral disc displacement, lumbar region: Secondary | ICD-10-CM | POA: Insufficient documentation

## 2013-04-11 DIAGNOSIS — M48061 Spinal stenosis, lumbar region without neurogenic claudication: Secondary | ICD-10-CM

## 2013-04-11 HISTORY — DX: Spinal stenosis, lumbar region without neurogenic claudication: M48.061

## 2013-04-11 HISTORY — DX: Other intervertebral disc displacement, lumbar region: M51.26

## 2013-05-28 ENCOUNTER — Other Ambulatory Visit (HOSPITAL_COMMUNITY): Payer: Self-pay | Admitting: Internal Medicine

## 2013-05-28 DIAGNOSIS — Z1231 Encounter for screening mammogram for malignant neoplasm of breast: Secondary | ICD-10-CM

## 2013-07-01 ENCOUNTER — Ambulatory Visit (HOSPITAL_COMMUNITY)
Admission: RE | Admit: 2013-07-01 | Discharge: 2013-07-01 | Disposition: A | Discharge status: Home / Self Care | Payer: 59 | Source: Ambulatory Visit | Attending: Internal Medicine | Admitting: Internal Medicine

## 2013-07-01 DIAGNOSIS — Z1231 Encounter for screening mammogram for malignant neoplasm of breast: Secondary | ICD-10-CM

## 2013-10-01 DIAGNOSIS — M47816 Spondylosis without myelopathy or radiculopathy, lumbar region: Secondary | ICD-10-CM

## 2013-10-01 HISTORY — DX: Spondylosis without myelopathy or radiculopathy, lumbar region: M47.816

## 2014-05-30 ENCOUNTER — Other Ambulatory Visit (HOSPITAL_COMMUNITY): Payer: Self-pay | Admitting: Internal Medicine

## 2014-05-30 DIAGNOSIS — Z1231 Encounter for screening mammogram for malignant neoplasm of breast: Secondary | ICD-10-CM

## 2014-07-15 ENCOUNTER — Ambulatory Visit (HOSPITAL_COMMUNITY): Payer: Medicare Other

## 2014-07-16 ENCOUNTER — Ambulatory Visit (HOSPITAL_COMMUNITY)
Admission: RE | Admit: 2014-07-16 | Discharge: 2014-07-16 | Disposition: A | Discharge status: Home / Self Care | Payer: Medicare Other | Source: Ambulatory Visit | Attending: Internal Medicine | Admitting: Internal Medicine

## 2014-07-16 DIAGNOSIS — Z1231 Encounter for screening mammogram for malignant neoplasm of breast: Secondary | ICD-10-CM | POA: Diagnosis present

## 2015-01-05 ENCOUNTER — Ambulatory Visit: Payer: Medicare Other | Admitting: Podiatry

## 2015-06-22 ENCOUNTER — Other Ambulatory Visit: Payer: Self-pay | Admitting: Orthopedic Surgery

## 2015-06-22 ENCOUNTER — Encounter (HOSPITAL_BASED_OUTPATIENT_CLINIC_OR_DEPARTMENT_OTHER): Payer: Self-pay | Admitting: *Deleted

## 2015-06-25 ENCOUNTER — Encounter (HOSPITAL_BASED_OUTPATIENT_CLINIC_OR_DEPARTMENT_OTHER)
Admission: RE | Disposition: A | Discharge status: Home / Self Care | Payer: Self-pay | Source: Ambulatory Visit | Attending: Orthopedic Surgery

## 2015-06-25 ENCOUNTER — Ambulatory Visit (HOSPITAL_BASED_OUTPATIENT_CLINIC_OR_DEPARTMENT_OTHER)
Admission: RE | Admit: 2015-06-25 | Discharge: 2015-06-25 | Disposition: A | Discharge status: Home / Self Care | Payer: Medicare Other | Source: Ambulatory Visit | Attending: Orthopedic Surgery | Admitting: Orthopedic Surgery

## 2015-06-25 ENCOUNTER — Ambulatory Visit (HOSPITAL_BASED_OUTPATIENT_CLINIC_OR_DEPARTMENT_OTHER): Payer: Medicare Other | Admitting: Certified Registered"

## 2015-06-25 ENCOUNTER — Encounter (HOSPITAL_BASED_OUTPATIENT_CLINIC_OR_DEPARTMENT_OTHER): Payer: Self-pay | Admitting: *Deleted

## 2015-06-25 DIAGNOSIS — E039 Hypothyroidism, unspecified: Secondary | ICD-10-CM | POA: Insufficient documentation

## 2015-06-25 DIAGNOSIS — M66872 Spontaneous rupture of other tendons, left ankle and foot: Secondary | ICD-10-CM | POA: Insufficient documentation

## 2015-06-25 HISTORY — PX: TENDON REPAIR: SHX5111

## 2015-06-25 HISTORY — PX: FOOT ARTHRODESIS: SHX1655

## 2015-06-25 HISTORY — PX: GASTROCNEMIUS RECESSION: SHX863

## 2015-06-25 SURGERY — RECESSION, MUSCLE, GASTROCNEMIUS
Anesthesia: General | Site: Leg Lower | Laterality: Left

## 2015-06-25 MED ORDER — FENTANYL CITRATE (PF) 100 MCG/2ML IJ SOLN
25.0000 ug | INTRAMUSCULAR | Status: DC | PRN
  Administered 2015-06-25: 50 ug via INTRAVENOUS
  Administered 2015-06-25 (×2): 25 ug via INTRAVENOUS

## 2015-06-25 MED ORDER — CEFAZOLIN SODIUM-DEXTROSE 2-4 GM/100ML-% IV SOLN
INTRAVENOUS | Status: AC
  Filled 2015-06-25: qty 100

## 2015-06-25 MED ORDER — DEXAMETHASONE SODIUM PHOSPHATE 10 MG/ML IJ SOLN
INTRAMUSCULAR | Status: AC
  Filled 2015-06-25: qty 1

## 2015-06-25 MED ORDER — GLYCOPYRROLATE 0.2 MG/ML IJ SOLN: 0.2000 mg | Freq: Once | INTRAMUSCULAR | Status: DC | PRN

## 2015-06-25 MED ORDER — ONDANSETRON HCL 4 MG/2ML IJ SOLN
INTRAMUSCULAR | Status: DC | PRN
  Administered 2015-06-25: 4 mg via INTRAVENOUS

## 2015-06-25 MED ORDER — SODIUM CHLORIDE 0.9 % IV SOLN: INTRAVENOUS | Status: DC

## 2015-06-25 MED ORDER — FENTANYL CITRATE (PF) 100 MCG/2ML IJ SOLN
INTRAMUSCULAR | Status: AC
  Filled 2015-06-25: qty 2

## 2015-06-25 MED ORDER — LIDOCAINE HCL (CARDIAC) 20 MG/ML IV SOLN
INTRAVENOUS | Status: DC | PRN
  Administered 2015-06-25: 60 mg via INTRAVENOUS

## 2015-06-25 MED ORDER — FENTANYL CITRATE (PF) 100 MCG/2ML IJ SOLN
50.0000 ug | INTRAMUSCULAR | Status: AC | PRN
  Administered 2015-06-25: 25 ug via INTRAVENOUS
  Administered 2015-06-25: 100 ug via INTRAVENOUS
  Administered 2015-06-25 (×2): 25 ug via INTRAVENOUS

## 2015-06-25 MED ORDER — CHLORHEXIDINE GLUCONATE 4 % EX LIQD: 60.0000 mL | Freq: Once | CUTANEOUS | Status: DC

## 2015-06-25 MED ORDER — CEFAZOLIN SODIUM-DEXTROSE 2-4 GM/100ML-% IV SOLN
2.0000 g | INTRAVENOUS | Status: AC
  Administered 2015-06-25: 2 g via INTRAVENOUS

## 2015-06-25 MED ORDER — BUPIVACAINE-EPINEPHRINE (PF) 0.5% -1:200000 IJ SOLN
INTRAMUSCULAR | Status: DC | PRN
  Administered 2015-06-25: 30 mL via PERINEURAL

## 2015-06-25 MED ORDER — LACTATED RINGERS IV SOLN: INTRAVENOUS | Status: DC

## 2015-06-25 MED ORDER — 0.9 % SODIUM CHLORIDE (POUR BTL) OPTIME
TOPICAL | Status: DC | PRN
  Administered 2015-06-25: 250 mL

## 2015-06-25 MED ORDER — SENNA 8.6 MG PO TABS: 2.0000 | ORAL_TABLET | Freq: Two times a day (BID) | ORAL | Status: DC

## 2015-06-25 MED ORDER — MIDAZOLAM HCL 2 MG/2ML IJ SOLN
INTRAMUSCULAR | Status: AC
  Filled 2015-06-25: qty 2

## 2015-06-25 MED ORDER — MIDAZOLAM HCL 2 MG/2ML IJ SOLN
1.0000 mg | INTRAMUSCULAR | Status: DC | PRN
  Administered 2015-06-25: 2 mg via INTRAVENOUS

## 2015-06-25 MED ORDER — LIDOCAINE 2% (20 MG/ML) 5 ML SYRINGE
INTRAMUSCULAR | Status: AC
  Filled 2015-06-25: qty 5

## 2015-06-25 MED ORDER — LACTATED RINGERS IV SOLN
INTRAVENOUS | Status: DC
  Administered 2015-06-25 (×2): via INTRAVENOUS

## 2015-06-25 MED ORDER — DOCUSATE SODIUM 100 MG PO CAPS: 100.0000 mg | ORAL_CAPSULE | Freq: Two times a day (BID) | ORAL | Status: DC

## 2015-06-25 MED ORDER — DEXAMETHASONE SODIUM PHOSPHATE 4 MG/ML IJ SOLN
INTRAMUSCULAR | Status: DC | PRN
  Administered 2015-06-25: 10 mg via INTRAVENOUS

## 2015-06-25 MED ORDER — ASPIRIN EC 81 MG PO TBEC: 81.0000 mg | DELAYED_RELEASE_TABLET | Freq: Two times a day (BID) | ORAL | Status: DC

## 2015-06-25 MED ORDER — OXYCODONE HCL 5 MG PO TABS: 5.0000 mg | ORAL_TABLET | ORAL | Status: DC | PRN

## 2015-06-25 MED ORDER — ONDANSETRON HCL 4 MG/2ML IJ SOLN
INTRAMUSCULAR | Status: AC
  Filled 2015-06-25: qty 2

## 2015-06-25 MED ORDER — PROPOFOL 10 MG/ML IV BOLUS
INTRAVENOUS | Status: DC | PRN
  Administered 2015-06-25: 150 mg via INTRAVENOUS

## 2015-06-25 MED ORDER — SCOPOLAMINE 1 MG/3DAYS TD PT72: 1.0000 | MEDICATED_PATCH | Freq: Once | TRANSDERMAL | Status: DC | PRN

## 2015-06-25 SURGICAL SUPPLY — 96 items
ANCH SUT 2 2.9 2 LD TPR NDL (Anchor) ×3 IMPLANT
ANCHOR JUGGERKNOT WTAP NDL 2.9 (Anchor) ×1 IMPLANT
BANDAGE ESMARK 6X9 LF (GAUZE/BANDAGES/DRESSINGS) ×3 IMPLANT
BIT DRILL JUGRKNT W/NDL BIT2.9 (DRILL) IMPLANT
BLADE AVERAGE 25X9 (BLADE) ×1 IMPLANT
BLADE MICRO SAGITTAL (BLADE) IMPLANT
BLADE OSC/SAG .038X5.5 CUT EDG (BLADE) IMPLANT
BLADE SURG 15 STRL LF DISP TIS (BLADE) ×6 IMPLANT
BLADE SURG 15 STRL SS (BLADE) ×12
BNDG CMPR 9X4 STRL LF SNTH (GAUZE/BANDAGES/DRESSINGS)
BNDG CMPR 9X6 STRL LF SNTH (GAUZE/BANDAGES/DRESSINGS) ×3
BNDG COHESIVE 4X5 TAN STRL (GAUZE/BANDAGES/DRESSINGS) ×4 IMPLANT
BNDG COHESIVE 6X5 TAN STRL LF (GAUZE/BANDAGES/DRESSINGS) ×4 IMPLANT
BNDG ESMARK 4X9 LF (GAUZE/BANDAGES/DRESSINGS) IMPLANT
BNDG ESMARK 6X9 LF (GAUZE/BANDAGES/DRESSINGS) ×4
CHLORAPREP W/TINT 26ML (MISCELLANEOUS) ×4 IMPLANT
COVER BACK TABLE 60X90IN (DRAPES) ×4 IMPLANT
CUFF TOURNIQUET SINGLE 34IN LL (TOURNIQUET CUFF) ×4 IMPLANT
DECANTER SPIKE VIAL GLASS SM (MISCELLANEOUS) IMPLANT
DRAPE EXTREMITY T 121X128X90 (DRAPE) ×4 IMPLANT
DRAPE OEC MINIVIEW 54X84 (DRAPES) ×4 IMPLANT
DRAPE U-SHAPE 47X51 STRL (DRAPES) ×4 IMPLANT
DRILL JUGGERKNOT W/NDL BIT 2.9 (DRILL) ×4
DRSG MEPITEL 4X7.2 (GAUZE/BANDAGES/DRESSINGS) ×4 IMPLANT
DRSG PAD ABDOMINAL 8X10 ST (GAUZE/BANDAGES/DRESSINGS) ×8 IMPLANT
ELECT REM PT RETURN 9FT ADLT (ELECTROSURGICAL) ×4
ELECTRODE REM PT RTRN 9FT ADLT (ELECTROSURGICAL) ×3 IMPLANT
GAUZE SPONGE 4X4 12PLY STRL (GAUZE/BANDAGES/DRESSINGS) ×4 IMPLANT
GLOVE BIO SURGEON STRL SZ8 (GLOVE) ×4 IMPLANT
GLOVE BIOGEL PI IND STRL 7.0 (GLOVE) IMPLANT
GLOVE BIOGEL PI IND STRL 8 (GLOVE) ×6 IMPLANT
GLOVE BIOGEL PI INDICATOR 7.0 (GLOVE) ×1
GLOVE BIOGEL PI INDICATOR 8 (GLOVE) ×2
GLOVE ECLIPSE 6.5 STRL STRAW (GLOVE) ×1 IMPLANT
GLOVE ECLIPSE 7.5 STRL STRAW (GLOVE) ×4 IMPLANT
GLOVE EXAM NITRILE MD LF STRL (GLOVE) ×1 IMPLANT
GOWN STRL REUS W/ TWL LRG LVL3 (GOWN DISPOSABLE) ×3 IMPLANT
GOWN STRL REUS W/ TWL XL LVL3 (GOWN DISPOSABLE) ×6 IMPLANT
GOWN STRL REUS W/TWL LRG LVL3 (GOWN DISPOSABLE) ×4
GOWN STRL REUS W/TWL XL LVL3 (GOWN DISPOSABLE) ×8
GUIDEWIRE 1.1X6IN (WIRE) ×1 IMPLANT
K-WIRE .062X4 (WIRE) IMPLANT
KIT BIO-TENODESIS 3X8 DISP (MISCELLANEOUS)
KIT INSRT BABSR STRL DISP BTN (MISCELLANEOUS) IMPLANT
NDL HYPO 25X1 1.5 SAFETY (NEEDLE) IMPLANT
NDL SAFETY ECLIPSE 18X1.5 (NEEDLE) IMPLANT
NDL SUT 6 .5 CRC .975X.05 MAYO (NEEDLE) IMPLANT
NEEDLE HYPO 18GX1.5 SHARP (NEEDLE)
NEEDLE HYPO 22GX1.5 SAFETY (NEEDLE) IMPLANT
NEEDLE HYPO 25X1 1.5 SAFETY (NEEDLE) ×8 IMPLANT
NEEDLE MAYO TAPER (NEEDLE)
NS IRRIG 1000ML POUR BTL (IV SOLUTION) ×4 IMPLANT
PACK BASIN DAY SURGERY FS (CUSTOM PROCEDURE TRAY) ×4 IMPLANT
PAD CAST 4YDX4 CTTN HI CHSV (CAST SUPPLIES) ×3 IMPLANT
PADDING CAST ABS 4INX4YD NS (CAST SUPPLIES)
PADDING CAST ABS COTTON 4X4 ST (CAST SUPPLIES) IMPLANT
PADDING CAST COTTON 4X4 STRL (CAST SUPPLIES) ×4
PADDING CAST COTTON 6X4 STRL (CAST SUPPLIES) ×4 IMPLANT
PADDING CAST SYNTHETIC 4 (CAST SUPPLIES) ×4
PADDING CAST SYNTHETIC 4X4 STR (CAST SUPPLIES) IMPLANT
PASSER SUT SWANSON 36MM LOOP (INSTRUMENTS) IMPLANT
PENCIL BUTTON HOLSTER BLD 10FT (ELECTRODE) ×4 IMPLANT
RETRIEVER SUT HEWSON (MISCELLANEOUS) IMPLANT
SANITIZER HAND PURELL 535ML FO (MISCELLANEOUS) ×4 IMPLANT
SCOTCHCAST PLUS 4X4 WHITE (CAST SUPPLIES) ×3 IMPLANT
SCREW CANN 3.0X40MM (Screw) ×1 IMPLANT
SCREW CANN RATTLER 5X20 (Screw) ×1 IMPLANT
SHEET MEDIUM DRAPE 40X70 STRL (DRAPES) ×4 IMPLANT
SLEEVE SCD COMPRESS KNEE MED (MISCELLANEOUS) ×4 IMPLANT
SPLINT FAST PLASTER 5X30 (CAST SUPPLIES) ×20
SPLINT PLASTER CAST FAST 5X30 (CAST SUPPLIES) ×60 IMPLANT
SPONGE LAP 18X18 X RAY DECT (DISPOSABLE) ×4 IMPLANT
SPONGE SURGIFOAM ABS GEL 12-7 (HEMOSTASIS) IMPLANT
STOCKINETTE 6  STRL (DRAPES) ×1
STOCKINETTE 6 STRL (DRAPES) ×3 IMPLANT
STRIP CLOSURE SKIN 1/2X4 (GAUZE/BANDAGES/DRESSINGS) IMPLANT
SUCTION FRAZIER HANDLE 10FR (MISCELLANEOUS) ×1
SUCTION TUBE FRAZIER 10FR DISP (MISCELLANEOUS) ×3 IMPLANT
SUT ETHIBOND 0 MO6 C/R (SUTURE) IMPLANT
SUT ETHIBOND 2 OS 4 DA (SUTURE) IMPLANT
SUT ETHILON 3 0 PS 1 (SUTURE) ×5 IMPLANT
SUT FIBERWIRE 2-0 18 17.9 3/8 (SUTURE)
SUT MERSILENE 2.0 SH NDLE (SUTURE) IMPLANT
SUT MNCRL AB 3-0 PS2 18 (SUTURE) ×4 IMPLANT
SUT MNCRL AB 4-0 PS2 18 (SUTURE) IMPLANT
SUT PROLENE 0 CT 2 (SUTURE) ×1 IMPLANT
SUT VIC AB 0 SH 27 (SUTURE) ×7 IMPLANT
SUT VIC AB 2-0 SH 27 (SUTURE)
SUT VIC AB 2-0 SH 27XBRD (SUTURE) ×3 IMPLANT
SUTURE FIBERWR 2-0 18 17.9 3/8 (SUTURE) IMPLANT
SYR BULB 3OZ (MISCELLANEOUS) ×4 IMPLANT
SYR CONTROL 10ML LL (SYRINGE) IMPLANT
TOWEL OR 17X24 6PK STRL BLUE (TOWEL DISPOSABLE) ×8 IMPLANT
TUBE CONNECTING 20X1/4 (TUBING) ×4 IMPLANT
UNDERPAD 30X30 (UNDERPADS AND DIAPERS) ×4 IMPLANT
YANKAUER SUCT BULB TIP NO VENT (SUCTIONS) IMPLANT

## 2015-06-25 NOTE — Discharge Instructions (Addendum)
Toni Arthurs, MD [facility]  Please read the following information regarding your care after surgery.  Medications  You only need a prescription for the narcotic pain medicine (ex. oxycodone, Percocet, Norco).  All of the other medicines listed below are available over the counter. X acetominophen (Tylenol) 650 mg every 4-6 hours as you need for minor pain X oxycodone as prescribed for moderate to severe pain ?   Narcotic pain medicine (ex. oxycodone, Percocet, Vicodin) will cause constipation.  To prevent this problem, take the following medicines while you are taking any pain medicine. X docusate sodium (Colace) 100 mg twice a day X senna (Senokot) 2 tablets twice a day  X To help prevent blood clots, take a baby aspirin (81 mg) twice a day after surgery until you are allowed to start bearing weight on you operative extremity.  You should also get up every hour while you are awake to move around.    Weight Bearing X Do not bear any weight on the operated leg or foot.  Cast / Splint / Dressing X Keep your splint or cast clean and dry.  Dont put anything (coat hanger, pencil, etc) down inside of it.  If it gets damp, use a hair dryer on the cool setting to dry it.  If it gets soaked, call the office to schedule an appointment for a cast change.     After your dressing, cast or splint is removed; you may shower, but do not soak or scrub the wound.  Allow the water to run over it, and then gently pat it dry.  Swelling It is normal for you to have swelling where you had surgery.  To reduce swelling and pain, keep your toes above your nose for at least 3 days after surgery.  It may be necessary to keep your foot or leg elevated for several weeks.  If it hurts, it should be elevated.  Follow Up Call my office at [phone] when you are discharged from the hospital or surgery center to schedule an appointment to be seen two weeks after surgery.  Call my office at [phone]  if you develop a fever >101.5 F, nausea, vomiting, bleeding from the surgical site or severe pain.    Regional Anesthesia Blocks  1. Numbness or the inability to move the "blocked" extremity may last from 3-48 hours after placement. The length of time depends on the medication injected and your individual response to the medication. If the numbness is not going away after 48 hours, call your surgeon.  2. The extremity that is blocked will need to be protected until the numbness is gone and the  Strength has returned. Because you cannot feel it, you will need to take extra care to avoid injury. Because it may be weak, you may have difficulty moving it or using it. You may not know what position it is in without looking at it while the block is in effect.  3. For blocks in the legs and feet, returning to weight bearing and walking needs to be done carefully. You will need to wait until the numbness is entirely gone and the strength has returned. You should be able to move your leg and foot normally before you try and bear weight or walk. You will need someone to be with you when you first try to ensure you do not fall and possibly risk injury.  4. Bruising and tenderness at the needle site are common side effects and will resolve in  a few days.  5. Persistent numbness or new problems with movement should be communicated to the surgeon or the [facility] [phone])/ [facility] [phone]).  Post Anesthesia Home Care Instructions  Activity: Get plenty of rest for the remainder of the day. A responsible adult should stay with you for 24 hours following the procedure.  For the next 24 hours, DO NOT: -Drive a car -Advertising copywriterperate machinery -Drink alcoholic beverages -Take any medication unless instructed by your physician -Make any legal decisions or sign important papers.  Meals: Start with liquid foods such as gelatin or soup. Progress to regular foods as tolerated.  Avoid greasy, spicy, heavy foods. If nausea and/or vomiting occur, drink only clear liquids until the nausea and/or vomiting subsides. Call your physician if vomiting continues.  Special Instructions/Symptoms: Your throat may feel dry or sore from the anesthesia or the breathing tube placed in your throat during surgery. If this causes discomfort, gargle with warm salt water. The discomfort should disappear within 24 hours.  If you had a scopolamine patch placed behind your ear for the management of post- operative nausea and/or vomiting:  1. The medication in the patch is effective for 72 hours, after which it should be removed.  Wrap patch in a tissue and discard in the trash. Wash hands thoroughly with soap and water. 2. You may remove the patch earlier than 72 hours if you experience unpleasant side effects which may include dry mouth, dizziness or visual disturbances. 3. Avoid touching the patch. Wash your hands with soap and water after contact with the patch.

## 2015-06-25 NOTE — Anesthesia Postprocedure Evaluation (Signed)
Anesthesia Post Note  Patient: Jessica [name]  Procedure(s) Performed: Procedure(s) (LRB): LEFT GASTROC RECESSION  (Left) LEFT ANTERIOR TIBIAL TENDON RECONSTRUCTION;  EXTENSOR HALLUCIS LONGUS TRANSFER  (Left) HALLUX INTERPHALANGEAL JOINT  ARTHRODESIS (Left)  Patient location during evaluation: PACU Anesthesia Type: General Level of consciousness: awake and alert Pain management: pain level controlled Vital Signs Assessment: post-procedure vital signs reviewed and stable Respiratory status: spontaneous breathing, nonlabored ventilation, respiratory function stable and patient connected to nasal cannula oxygen Cardiovascular status: blood pressure returned to baseline and stable Postop Assessment: no signs of nausea or vomiting Anesthetic complications: no    Last Vitals:  Filed Vitals:   06/25/15 1300 06/25/15 1315  BP: 144/87 144/80  Pulse: 87 87  Temp:    Resp: 20 16    Last Pain:  Filed Vitals:   06/25/15 1317  PainSc: 5                  Wynn Alldredge L

## 2015-06-25 NOTE — Op Note (Signed)
NAMMaia Plan:  Nishikawa,                      ACCOUNT NO.:  [id number]  MEDICAL RECORD NO.:  [id number]  LOCATION:                                 FACILITY:  PHYSICIAN:  Toni ArthursJohn Zacharius Funari, MD             DATE OF BIRTH:  DATE OF PROCEDURE:  06/25/2015 DATE OF DISCHARGE:                              OPERATIVE REPORT   PREOPERATIVE DIAGNOSES: 1. Left tibialis anterior tendon rupture. 2. Tight left heel cord.  POSTOPERATIVE DIAGNOSES: 1. Left tibialis anterior tendon rupture. 2. Tight left heel cord.  PROCEDURE: 1. Left gastrocnemius recession through a separate incision. 2. Left tibialis anterior tendon reconstruction. 3. Transfer of left extensor hallucis longus tendon to the medial     cuneiform. 4. Left hallux interphalangeal joint arthrodesis. 5. Left foot AP and lateral radiographs. 6. Left hallux interphalangeal joint arthrodesis (separate incision).  SURGEON:  Toni ArthursJohn Axcel Horsch, MD.  ASSISTANT:  Alfredo MartinezJustin Ollis, PA-C.  ANESTHESIA:  General, regional.  ESTIMATED BLOOD LOSS:  Minimal.  TOURNIQUET TIME:  96 minutes at 250 mmHg.  COMPLICATIONS:  None apparent.  DISPOSITION:  Extubated, awake, and stable to recovery.  INDICATION FOR PROCEDURE:  The patient is a 75 year old woman without significant past medical history.  She ruptured her left tibialis anterior tendon several weeks ago.  She presents today for operative treatment of this injury.  She understands the risks and benefits, the alternative treatment options, and elects surgical treatment.  She specifically understands risks of bleeding, infection, nerve damage, blood clots, need for additional surgery, continued pain, amputation, and death.  PROCEDURE IN DETAIL:  After preoperative consent was obtained and the correct operative site was identified, the patient was brought to the operating room and placed supine on the operating table.  General anesthesia was induced.  Preoperative antibiotics were administered. Surgical  time-out was taken.  Left lower extremity was prepped and draped in standard sterile fashion with tourniquet around the thigh. The extremity was exsanguinated and the tourniquet was inflated to 250 mmHg.  A longitudinal incision was then made over the medial calf. Sharp dissection was carried down through skin and subcutaneous tissue. The superficial fascia was incised.  The gastrocnemius tendon was identified.  The sural nerve was protected as the gastrocnemius tendon was divided from medial to lateral under direct vision.  The patient had no identifiable plantaris tendon.  Wound was irrigated and closed with Monocryl and nylon.  Attention was then turned to the anterior aspect of the ankle.  A curvilinear incision was then made along the course of the tibialis anterior tendon curving down to the medial aspect of the midfoot over the medial cuneiform.  Sharp dissection was carried down through skin and subcutaneous tissue.  The extensor retinaculum was incised. Tibialis anterior tendon was noted to be ruptured.  There were few wisps of tendon fibers at the insertion at the medial cuneiform, but really no distal stump of any significance could be identified.  The insertion point of the tibialis anterior tendon was cleared up with subperiosteal dissection.  A suture anchor was inserted and a Bunnel suture was placed in the end of the tibialis  anterior tendon.  The suture was used as a pulley and the tendon was advanced down to its insertion. Unfortunately, there was a large gap between the healthy tendon fibers and the insertion at the medial cuneiform.  Since there was insufficient tendon to be repaired primarily, the decision was made to proceed with reconstruction.  At this point, a separate incision was made transversely over the IP joint of the hallux.  The insertion of the EHL was released and dissected free.  The EHL tendon was then withdrawn through the proximal incision.  The  dorsal aspect of the medial cuneiform was identified and a K-wire was inserted.  A 5.2 mm hole was drilled in the medial cuneiform after confirming appropriate location on AP and lateral radiographs.  A 5-mm Biomet Rattler interference screw was selected.  The EHL tendon was then whip stitched and passed down through the drill hole using a suture passer.  The tendon was secured to the medial cuneiform using the Rattler interference screw.  It was noted to have excellent purchase.  The distal end of the EHL was then advanced back retrograde to the tibialis anterior tendon stump.  These were sewn together using a Pulver-taft weave.  0 Vicryl horizontal mattress sutures were used to repair the tendon.  The wisps of tibialis anterior tendon were then advanced over the repair site sewn in also with 0 Vicryl sutures.  The wound was irrigated copiously.  The extensor retinaculum was repaired back over the tendon with simple figure-of- eight sutures of 0 Vicryl.  Subcutaneous tissues were approximated with Monocryl.  Skin was closed with horizontal mattress sutures of 3-0 nylon.  Attention was then returned to the EHL, where the transverse incision was again identified.  The oscillating saw was used to resect the head of the proximal phalanx and the base of the middle phalanx.  The joint was reduced and a 3 mm Biomet partially threaded cannulated screw was inserted through the tip of the toe and was noted to have excellent purchase compressing the joint appropriately.  AP and lateral radiographs confirmed appropriate position of the screw and appropriate reduction of the IP joint of the hallux.  Wounds were irrigated and closed with nylon.  Sterile dressings were applied followed by a well- padded and short-leg cast.  Tourniquet was released after application of the dressings at 96 minutes.  The patient was awakened from anesthesia and transported to the recovery room in stable  condition.  FOLLOWUP PLAN:  The patient will be nonweightbearing on the left lower extremity.  She will follow up with me in the office in 2 weeks for suture removal and conversion to a walking cast.  RADIOGRAPHS:  AP and lateral radiographs of the left foot were obtained today.  These show interval arthrodesis of the hallux IP joint.  No other acute injuries are noted.  Alfredo Martinez, PA-C was present and scrubbed for the duration of the case.  His assistance was essential in positioning the patient, prepping and draping, gaining and maintaining exposure, performing the operation, closing and dressing the wounds, and applying the cast.     Toni Arthurs, MD     JH/MEDQ  D:  06/25/2015  T:  06/25/2015  Job:  [id number]

## 2015-06-25 NOTE — Anesthesia Procedure Notes (Addendum)
Anesthesia Regional Block:  Popliteal block  Pre-Anesthetic Checklist: ,, timeout performed, Correct Patient, Correct Site, Correct Laterality, Correct Procedure, Correct Position, site marked, Risks and benefits discussed,  Surgical consent,  Pre-op evaluation,  At surgeon's request and post-op pain management  Laterality: Left  Prep: chloraprep       Needles:  Injection technique: Single-shot  Needle Type: Echogenic Stimulator Needle     Needle Length: 9cm 9 cm Needle Gauge: 21 and 21 G    Additional Needles:  Procedures: ultrasound guided (picture in chart) and nerve stimulator Popliteal block  Nerve Stimulator or Paresthesia:  Response: plantar flexion, 0.45 mA,   Additional Responses:   Narrative:  Start time: 06/25/2015 8:35 AM End time: 06/25/2015 8:45 AM Injection made incrementally with aspirations every 5 mL.  Performed by: Personally  Anesthesiologist: Ronelle NighEWELL, CHARLES  Additional Notes: A functioning IV was confirmed and monitors were applied.  Sterile prep and drape, hand hygiene and sterile gloves were used.  Negative aspiration and test dose prior to incremental administration of local anesthetic. The patient tolerated the procedure well.Ultrasound  guidance: relevant anatomy identified, needle position confirmed, local anesthetic spread visualized around nerve(s), vascular puncture avoided.  Image printed for medical record.    Procedure Name: LMA Insertion Date/Time: 06/25/2015 10:25 AM Performed by: Gar GibbonKEETON, Deniel Mcquiston S Pre-anesthesia Checklist: Patient identified, Emergency Drugs available, Suction available and Patient being monitored Patient Re-evaluated:Patient Re-evaluated prior to inductionOxygen Delivery Method: Circle System Utilized Preoxygenation: Pre-oxygenation with 100% oxygen Intubation Type: IV induction Ventilation: Mask ventilation without difficulty LMA: LMA inserted LMA Size: 4.0 Number of attempts: 1 Airway Equipment and Method: Bite  block Placement Confirmation: positive ETCO2 Tube secured with: Tape Dental Injury: Teeth and Oropharynx as per pre-operative assessment

## 2015-06-25 NOTE — H&P (Signed)
Jessica NorthJoan M [name] is an 75 y.o. female.   Chief Complaint: left ankle pain HPI: 75 y/o female with left foot drop and tight heelcord after a tibialis anterior tendon rupture. She presents now for operative treatment of this injury.  Past Medical History  Diagnosis Date  . Hypothyroidism   . Arthritis     Past Surgical History  Procedure Laterality Date  . Carpal tunnel release      right  . Eye surgery      cataract bilateral  . Anterior cervical decomp/discectomy fusion  12/13/2011    Procedure: ANTERIOR CERVICAL DECOMPRESSION/DISCECTOMY FUSION 3 LEVELS;  Surgeon: Temple PaciniHenry A Pool, MD;  Location: [facility];  Service: Neurosurgery;  Laterality: N/A;  Cervical Five-Six Cervical Six-Seven Cervical Seven-Thoracic One Anterior cervical decompression/diskectomy/fusion/plating/allograft    History reviewed. No pertinent family history. Social History:  reports that she has never smoked. She does not have any smokeless tobacco history on file. She reports that she drinks alcohol. She reports that she does not use illicit drugs.  Allergies: No Known Allergies  Medications Prior to Admission  Medication Sig Dispense Refill  . ergocalciferol (VITAMIN D2) 50000 UNITS capsule Take 50,000 Units by mouth every 30 (thirty) days. Due on 10/15    . fish oil-omega-3 fatty acids 1000 MG capsule Take 1 g by mouth daily.    Marland Kitchen. ibuprofen (ADVIL,MOTRIN) 200 MG tablet Take 400 mg by mouth every 6 (six) hours as needed. For pain    . levothyroxine (SYNTHROID, LEVOTHROID) 100 MCG tablet Take 100 mcg by mouth daily.    . raloxifene (EVISTA) 60 MG tablet Take 60 mg by mouth daily.      No results found for this or any previous visit (from the past 48 hour(s)). No results found.  ROS  No recent f/c/n/v/wt loss  Blood pressure 118/64, pulse 75, temperature 97.9 F (36.6 C), temperature source Oral, resp. rate 13, height 5\' 8"  (1.727 m), weight 75.297 kg (166 lb), SpO2 100 %. Physical Exam  wn wd woman in nad.   A and O x 4.  Mood and affect normal.  EOMI.  Resp unlabored.  L ankle with no palpable tib ant tendon at the insertion.  Swelling at the extensor retinaculum.  Skin o/w health yand intact.  No lymphadenopathy.  55/ sterngth in PF.  Heelcord is tight.  Sens to LT intact dorsally and plantarly at the foot.  Assessment/Plan L tib ant tendon rupture and tight heelcord - to OR for gastroc recession and tib ant repair v. Reconstruction.  The risks and benefits of the alternative treatment options have been discussed in detail.  The patient wishes to proceed with surgery and specifically understands risks of bleeding, infection, nerve damage, blood clots, need for additional surgery, amputation and death.   Toni ArthursHEWITT, Florene Brill, MD 06/25/2015, 9:56 AM

## 2015-06-25 NOTE — Progress Notes (Signed)
Assisted Dr. Leta JunglingEwell with left, ultrasound guided, popliteal block. Side rails up, monitors on throughout procedure. See vital signs in flow sheet. Tolerated Procedure well.

## 2015-06-25 NOTE — Anesthesia Preprocedure Evaluation (Signed)
Anesthesia Evaluation  Patient identified by MRN, date of birth, ID band Patient awake    Reviewed: Allergy & Precautions, H&P , NPO status , Patient's Chart, lab work & pertinent test results, reviewed documented beta blocker date and time   Airway Mallampati: II  TM Distance: >3 FB Neck ROM: limited    Dental  (+) Teeth Intact, Dental Advisory Given   Pulmonary neg pulmonary ROS,    breath sounds clear to auscultation       Cardiovascular negative cardio ROS   Rhythm:regular     Neuro/Psych negative neurological ROS  negative psych ROS   GI/Hepatic negative GI ROS, Neg liver ROS,   Endo/Other  Hypothyroidism   Renal/GU negative Renal ROS  negative genitourinary   Musculoskeletal   Abdominal   Peds  Hematology negative hematology ROS (+)   Anesthesia Other Findings See surgeon's H&P   Reproductive/Obstetrics negative OB ROS                             Anesthesia Physical  Anesthesia Plan  ASA: II  Anesthesia Plan: General   Post-op Pain Management: GA combined w/ Regional for post-op pain   Induction: Intravenous  Airway Management Planned: LMA  Additional Equipment:   Intra-op Plan:   Post-operative Plan: Extubation in OR  Informed Consent: I have reviewed the patients History and Physical, chart, labs and discussed the procedure including the risks, benefits and alternatives for the proposed anesthesia with the patient or authorized representative who has indicated his/her understanding and acceptance.   Dental Advisory Given  Plan Discussed with: CRNA, Surgeon and Anesthesiologist  Anesthesia Plan Comments:         Anesthesia Quick Evaluation

## 2015-06-25 NOTE — Transfer of Care (Signed)
Immediate Anesthesia Transfer of Care Note  Patient: Jessica NorthJoan M Aeschliman  Procedure(s) Performed: Procedure(s): LEFT GASTROC RECESSION  (Left) LEFT ANTERIOR TIBIAL TENDON RECONSTRUCTION;  EXTENSOR HALLUCIS LONGUS TRANSFER  (Left) HALLUX INTERPHALANGEAL JOINT  ARTHRODESIS (Left)  Patient Location: PACU  Anesthesia Type:GA combined with regional for post-op pain  Level of Consciousness: awake, sedated and patient cooperative  Airway & Oxygen Therapy: Patient Spontanous Breathing and Patient connected to face mask oxygen  Post-op Assessment: Report given to RN and Post -op Vital signs reviewed and stable  Post vital signs: Reviewed and stable  Last Vitals:  Filed Vitals:   06/25/15 0915 06/25/15 0930  BP: 121/62 118/64  Pulse: 84 75  Temp:    Resp: 14 13    Last Pain: There were no vitals filed for this visit.    Patients Stated Pain Goal: 0 (06/25/15 0818)  Complications: No apparent anesthesia complications

## 2015-06-25 NOTE — Brief Op Note (Signed)
06/25/2015  12:22 PM  PATIENT:  Jessica GulaJoan M [name]  75 y.o. female  PRE-OPERATIVE DIAGNOSIS:  LEFT TIBIALIS ANTERIOR TENDON RUPURE; TIGHT HEEL CORD   POST-OPERATIVE DIAGNOSIS:  LEFT TIBIALIS ANTERIOR TENDON RUPURE; TIGHT HEEL CORD   Procedure(s): 1.  LEFT GASTROC RECESSION (separate incision) 2.  LEFT ANTERIOR TIBIAL TENDON RECONSTRUCTION 3.  Left EXTENSOR HALLUCIS LONGUS TRANSFER to the medial cuneiform 4.  Left HALLUX INTERPHALANGEAL JOINT  ARTHRODESIS (separate incision) 5.  Left foot AP and lateral xrays  SURGEON:  Toni ArthursJohn Cathaleen Korol, MD  ASSISTANT: Alfredo MartinezJustin Ollis, PA-C  ANESTHESIA:   General, regional  EBL:  minimal   TOURNIQUET:   Total Tourniquet Time Documented: Thigh (Left) - 96 minutes Total: Thigh (Left) - 96 minutes  COMPLICATIONS:  None apparent  DISPOSITION:  Extubated, awake and stable to recovery.  DICTATION ID:

## 2015-06-28 ENCOUNTER — Encounter (HOSPITAL_BASED_OUTPATIENT_CLINIC_OR_DEPARTMENT_OTHER): Payer: Self-pay | Admitting: Orthopedic Surgery

## 2015-07-13 ENCOUNTER — Other Ambulatory Visit: Payer: Self-pay

## 2015-09-10 ENCOUNTER — Other Ambulatory Visit: Payer: Self-pay | Admitting: Internal Medicine

## 2015-09-10 DIAGNOSIS — Z1231 Encounter for screening mammogram for malignant neoplasm of breast: Secondary | ICD-10-CM

## 2015-09-29 ENCOUNTER — Ambulatory Visit: Payer: Medicare Other

## 2015-09-29 ENCOUNTER — Ambulatory Visit
Admission: RE | Admit: 2015-09-29 | Discharge: 2015-09-29 | Disposition: A | Discharge status: Home / Self Care | Payer: Medicare Other | Source: Ambulatory Visit | Attending: Internal Medicine | Admitting: Internal Medicine

## 2015-09-29 DIAGNOSIS — Z1231 Encounter for screening mammogram for malignant neoplasm of breast: Secondary | ICD-10-CM

## 2015-09-30 ENCOUNTER — Other Ambulatory Visit: Payer: Self-pay | Admitting: Internal Medicine

## 2015-09-30 DIAGNOSIS — R928 Other abnormal and inconclusive findings on diagnostic imaging of breast: Secondary | ICD-10-CM

## 2015-10-05 ENCOUNTER — Ambulatory Visit
Admission: RE | Admit: 2015-10-05 | Discharge: 2015-10-05 | Disposition: A | Discharge status: Home / Self Care | Payer: Medicare Other | Source: Ambulatory Visit | Attending: Internal Medicine | Admitting: Internal Medicine

## 2015-10-05 DIAGNOSIS — R928 Other abnormal and inconclusive findings on diagnostic imaging of breast: Secondary | ICD-10-CM

## 2015-12-11 NOTE — Progress Notes (Signed)
Scheduling pre op- need SURGICAL ORDERS PLACED IN EPIC PLEASE

## 2016-01-08 NOTE — H&P (Signed)
TOTAL KNEE ADMISSION H&P  Patient is being admitted for right total knee arthroplasty.  Subjective:  Chief Complaint:right knee primary OA / pain  HPI: Jessica [name], 75 y.o. female, has a history of pain and functional disability in the right knee due to arthritis and has failed non-surgical conservative treatments for greater than 12 weeks to include NSAID's and/or analgesics, corticosteriod injections, viscosupplementation injections, use of assistive devices and activity modification.  Onset of symptoms was gradual, starting >10 years ago with gradually worsening course since that time. The patient noted no past surgery on the right knee(s).  Patient currently rates pain in the right knee(s) at 8 out of 10 with activity. Patient has night pain, worsening of pain with activity and weight bearing, pain that interferes with activities of daily living, pain with passive range of motion, crepitus and joint swelling.  Patient has evidence of periarticular osteophytes and joint space narrowing by imaging studies.  There is no active infection.   Risks, benefits and expectations were discussed with the patient.  Risks including but not limited to the risk of anesthesia, blood clots, nerve damage, blood vessel damage, failure of the prosthesis, infection and up to and including death.  Patient understand the risks, benefits and expectations and wishes to proceed with surgery.   PCP: Gaye AlkenMOON,AMY, NP  D/C Plans:      Home  Post-op Meds:       No Rx given   Tranexamic Acid:      To be given - IV  Decadron:      Is to be given  FYI:     ASA  Norco    Patient Active Problem List   Diagnosis Date Noted  . Cervical spinal stenosis 12/13/2011   Past Medical History:  Diagnosis Date  . Arthritis   . Hypothyroidism     Past Surgical History:  Procedure Laterality Date  . ANTERIOR CERVICAL DECOMP/DISCECTOMY FUSION  12/13/2011   Procedure: ANTERIOR CERVICAL DECOMPRESSION/DISCECTOMY FUSION 3 LEVELS;   Surgeon: Temple PaciniHenry A Pool, MD;  Location: [facility];  Service: Neurosurgery;  Laterality: N/A;  Cervical Five-Six Cervical Six-Seven Cervical Seven-Thoracic One Anterior cervical decompression/diskectomy/fusion/plating/allograft  . CARPAL TUNNEL RELEASE     right  . EYE SURGERY     cataract bilateral  . FOOT ARTHRODESIS Left 06/25/2015   Procedure: HALLUX INTERPHALANGEAL JOINT  ARTHRODESIS;  Surgeon: Toni ArthursJohn Hewitt, MD;  Location: [facility];  Service: Orthopedics;  Laterality: Left;  Marland Kitchen. GASTROCNEMIUS RECESSION Left 06/25/2015   Procedure: LEFT GASTROC RECESSION ;  Surgeon: Toni ArthursJohn Hewitt, MD;  Location: [facility];  Service: Orthopedics;  Laterality: Left;  . TENDON REPAIR Left 06/25/2015   Procedure: LEFT ANTERIOR TIBIAL TENDON RECONSTRUCTION;  EXTENSOR HALLUCIS LONGUS TRANSFER ;  Surgeon: Toni ArthursJohn Hewitt, MD;  Location: [facility];  Service: Orthopedics;  Laterality: Left;    No prescriptions prior to admission.   No Known Allergies   Social History  Substance Use Topics  . Smoking status: Never Smoker  . Smokeless tobacco: Not on file  . Alcohol use Yes     Comment: rarely       Review of Systems  Constitutional: Negative.   HENT: Negative.   Eyes: Negative.   Respiratory: Negative.   Cardiovascular: Negative.   Gastrointestinal: Negative.   Genitourinary: Negative.   Musculoskeletal: Positive for joint pain.  Skin: Negative.   Neurological: Positive for dizziness.  Endo/Heme/Allergies: Negative.   Psychiatric/Behavioral: Negative.     Objective:  Physical  Exam  Constitutional: She is oriented to person, place, and time. She appears well-developed.  HENT:  Head: Normocephalic.  Eyes: Pupils are equal, round, and reactive to light.  Neck: Neck supple. No JVD present. No tracheal deviation present. No thyromegaly present.  Cardiovascular: Normal rate, regular rhythm, normal heart sounds and intact distal pulses.   Respiratory: Effort  normal and breath sounds normal. No respiratory distress. She has no wheezes.  GI: Soft. There is no tenderness. There is no guarding.  Musculoskeletal:       Right knee: She exhibits decreased range of motion, swelling, abnormal alignment and bony tenderness. She exhibits no ecchymosis, no deformity, no laceration and no erythema. Tenderness found.  Lymphadenopathy:    She has no cervical adenopathy.  Neurological: She is alert and oriented to person, place, and time. A sensory deficit (numbness left foot) is present.  Skin: Skin is warm and dry.  Psychiatric: She has a normal mood and affect.     Labs:  Estimated body mass index is 25.24 kg/m as calculated from the following:   Height as of 06/25/15: 5\' 8"  (1.727 m).   Weight as of 06/25/15: 75.3 kg (166 lb).   Imaging Review Plain radiographs demonstrate severe degenerative joint disease of the right knee(s). The overall alignment ismild valgus. The bone quality appears to be good for age and reported activity level.  Assessment/Plan:  End stage arthritis, right knee   The patient history, physical examination, clinical judgment of the provider and imaging studies are consistent with end stage degenerative joint disease of the right knee(s) and total knee arthroplasty is deemed medically necessary. The treatment options including medical management, injection therapy arthroscopy and arthroplasty were discussed at length. The risks and benefits of total knee arthroplasty were presented and reviewed. The risks due to aseptic loosening, infection, stiffness, patella tracking problems, thromboembolic complications and other imponderables were discussed. The patient acknowledged the explanation, agreed to proceed with the plan and consent was signed. Patient is being admitted for inpatient treatment for surgery, pain control, PT, OT, prophylactic antibiotics, VTE prophylaxis, progressive ambulation and ADL's and discharge planning. The patient  is planning to be discharged home.     Anastasio AuerbachMatthew S. Annora Guderian   PA-C  01/08/2016, 9:17 PM

## 2016-01-13 NOTE — Patient Instructions (Signed)
Jessica [name]  01/13/2016   Your procedure is scheduled on: 01/25/2016    [facility]  [facility] to 3rd floor to  Short Stay Center at    0530 AM.  Call this number if you have problems the morning of surgery [phone]   Remember: ONLY 1 PERSON MAY GO WITH YOU TO SHORT STAY TO GET  READY MORNING OF YOUR SURGERY.  Do not eat food or drink liquids :After Midnight.     Take these medicines the morning of surgery with A SIP OF WATER: Synthroid                                 You may not have any metal on your body including hair pins and              piercings  Do not wear jewelry, make-up, lotions, powders or perfumes, deodorant             Do not wear nail polish.  Do not shave  48 hours prior to surgery.                 Do not bring valuables to the hospital. [facility] IS NOT             RESPONSIBLE   FOR VALUABLES.  Contacts, dentures or bridgework may not be worn into surgery.  Leave suitcase in the car. After surgery it may be brought to your room.         Special Instructions: N/A              Please read over the following fact sheets you were given: _____________________________________________________________________             [facility] - Preparing for [facility] Before surgery, you can play an important role.  Because skin is not sterile, your skin needs to be as free of germs as possible.  You can reduce the number of germs on your skin by washing with CHG (chlorahexidine gluconate) soap before surgery.  CHG is an antiseptic cleaner which kills germs and bonds with the skin to continue killing germs even after washing. Please DO NOT use if you have an allergy to CHG or antibacterial soaps.  If your skin becomes reddened/irritated stop using the CHG and inform your nurse when you arrive at Short Stay. Do not shave (including legs and underarms) for at least 48 hours prior to the first CHG shower.  You may  shave your face/neck. Please follow these instructions carefully:  1.  Shower with CHG Soap the night before surgery and the  morning of Surgery.  2.  If you choose to wash your hair, wash your hair first as usual with your  normal  shampoo.  3.  After you shampoo, rinse your hair and body thoroughly to remove the  shampoo.                           4.  Use CHG as you would any other liquid soap.  You can apply chg directly  to the skin and wash                       Gently with a scrungie or clean  washcloth.  5.  Apply the CHG Soap to your body ONLY FROM THE NECK DOWN.   Do not use on face/ open                           Wound or open sores. Avoid contact with eyes, ears mouth and genitals (private parts).                       Wash face,  Genitals (private parts) with your normal soap.             6.  Wash thoroughly, paying special attention to the area where your surgery  will be performed.  7.  Thoroughly rinse your body with warm water from the neck down.  8.  DO NOT shower/wash with your normal soap after using and rinsing off  the CHG Soap.                9.  Pat yourself dry with a clean towel.            10.  Wear clean pajamas.            11.  Place clean sheets on your bed the night of your first shower and do not  sleep with pets. Day of Surgery : Do not apply any lotions/deodorants the morning of surgery.  Please wear clean clothes to the hospital/surgery center.  FAILURE TO FOLLOW THESE INSTRUCTIONS MAY RESULT IN THE CANCELLATION OF YOUR SURGERY PATIENT SIGNATURE_________________________________  NURSE SIGNATURE__________________________________  ________________________________________________________________________  WHAT IS A BLOOD TRANSFUSION? Blood Transfusion Information  A transfusion is the replacement of blood or some of its parts. Blood is made up of multiple cells which provide different functions.  Red blood cells carry oxygen and are used for blood loss  replacement.  White blood cells fight against infection.  Platelets control bleeding.  Plasma helps clot blood.  Other blood products are available for specialized needs, such as hemophilia or other clotting disorders. BEFORE THE TRANSFUSION  Who gives blood for transfusions?   Healthy volunteers who are fully evaluated to make sure their blood is safe. This is blood bank blood. Transfusion therapy is the safest it has ever been in the practice of medicine. Before blood is taken from a donor, a complete history is taken to make sure that person has no history of diseases nor engages in risky social behavior (examples are intravenous drug use or sexual activity with multiple partners). The donor's travel history is screened to minimize risk of transmitting infections, such as malaria. The donated blood is tested for signs of infectious diseases, such as HIV and hepatitis. The blood is then tested to be sure it is compatible with you in order to minimize the chance of a transfusion reaction. If you or a relative donates blood, this is often done in anticipation of surgery and is not appropriate for emergency situations. It takes many days to process the donated blood. RISKS AND COMPLICATIONS Although transfusion therapy is very safe and saves many lives, the main dangers of transfusion include:   Getting an infectious disease.  Developing a transfusion reaction. This is an allergic reaction to something in the blood you were given. Every precaution is taken to prevent this. The decision to have a blood transfusion has been considered carefully by your caregiver before blood is given. Blood is not given unless the benefits outweigh the risks. AFTER THE TRANSFUSION  Right after receiving a blood transfusion, you will usually feel much better and more energetic. This is especially true if your red blood cells have gotten low (anemic). The transfusion raises the level of the red blood cells which  carry oxygen, and this usually causes an energy increase.  The nurse administering the transfusion will monitor you carefully for complications. HOME CARE INSTRUCTIONS  No special instructions are needed after a transfusion. You may find your energy is better. Speak with your caregiver about any limitations on activity for underlying diseases you may have. SEEK MEDICAL CARE IF:   Your condition is not improving after your transfusion.  You develop redness or irritation at the intravenous (IV) site. SEEK IMMEDIATE MEDICAL CARE IF:  Any of the following symptoms occur over the next 12 hours:  Shaking chills.  You have a temperature by mouth above 102 F (38.9 C), not controlled by medicine.  Chest, back, or muscle pain.  People around you feel you are not acting correctly or are confused.  Shortness of breath or difficulty breathing.  Dizziness and fainting.  You get a rash or develop hives.  You have a decrease in urine output.  Your urine turns a dark color or changes to pink, red, or brown. Any of the following symptoms occur over the next 10 days:  You have a temperature by mouth above 102 F (38.9 C), not controlled by medicine.  Shortness of breath.  Weakness after normal activity.  The white part of the eye turns yellow (jaundice).  You have a decrease in the amount of urine or are urinating less often.  Your urine turns a dark color or changes to pink, red, or brown. Document Released: 02/05/2000 Document Revised: 05/02/2011 Document Reviewed: 09/24/2007 ExitCare Patient Information 2014 [place].  _______________________________________________________________________  Incentive Spirometer  An incentive spirometer is a tool that can help keep your lungs clear and active. This tool measures how well you are filling your lungs with each breath. Taking long deep breaths may help reverse or decrease the chance of developing breathing (pulmonary) problems  (especially infection) following:  A long period of time when you are unable to move or be active. BEFORE THE PROCEDURE   If the spirometer includes an indicator to show your best effort, your nurse or respiratory therapist will set it to a desired goal.  If possible, sit up straight or lean slightly forward. Try not to slouch.  Hold the incentive spirometer in an upright position. INSTRUCTIONS FOR USE  1. Sit on the edge of your bed if possible, or sit up as far as you can in bed or on a chair. 2. Hold the incentive spirometer in an upright position. 3. Breathe out normally. 4. Place the mouthpiece in your mouth and seal your lips tightly around it. 5. Breathe in slowly and as deeply as possible, raising the piston or the ball toward the top of the column. 6. Hold your breath for 3-5 seconds or for as long as possible. Allow the piston or ball to fall to the bottom of the column. 7. Remove the mouthpiece from your mouth and breathe out normally. 8. Rest for a few seconds and repeat Steps 1 through 7 at least 10 times every 1-2 hours when you are awake. Take your time and take a few normal breaths between deep breaths. 9. The spirometer may include an indicator to show your best effort. Use the indicator as a goal to work toward during each repetition. 10. After  each set of 10 deep breaths, practice coughing to be sure your lungs are clear. If you have an incision (the cut made at the time of surgery), support your incision when coughing by placing a pillow or rolled up towels firmly against it. Once you are able to get out of bed, walk around indoors and cough well. You may stop using the incentive spirometer when instructed by your caregiver.  RISKS AND COMPLICATIONS  Take your time so you do not get dizzy or light-headed.  If you are in pain, you may need to take or ask for pain medication before doing incentive spirometry. It is harder to take a deep breath if you are having  pain. AFTER USE  Rest and breathe slowly and easily.  It can be helpful to keep track of a log of your progress. Your caregiver can provide you with a simple table to help with this. If you are using the spirometer at home, follow these instructions: [place] IF:   You are having difficultly using the spirometer.  You have trouble using the spirometer as often as instructed.  Your pain medication is not giving enough relief while using the spirometer.  You develop fever of 100.5 F (38.1 C) or higher. SEEK IMMEDIATE MEDICAL CARE IF:   You cough up bloody sputum that had not been present before.  You develop fever of 102 F (38.9 C) or greater.  You develop worsening pain at or near the incision site. MAKE SURE YOU:   Understand these instructions.  Will watch your condition.  Will get help right away if you are not doing well or get worse. Document Released: 06/20/2006 Document Revised: 05/02/2011 Document Reviewed: 08/21/2006 [facility] Patient Information 2014 [place], [place].   ________________________________________________________________________

## 2016-01-18 ENCOUNTER — Encounter (HOSPITAL_COMMUNITY): Payer: Self-pay

## 2016-01-18 ENCOUNTER — Encounter (HOSPITAL_COMMUNITY)
Admission: RE | Admit: 2016-01-18 | Discharge: 2016-01-18 | Disposition: A | Discharge status: Home / Self Care | Payer: Medicare Other | Source: Ambulatory Visit | Attending: Orthopedic Surgery | Admitting: Orthopedic Surgery

## 2016-01-18 DIAGNOSIS — Z01818 Encounter for other preprocedural examination: Secondary | ICD-10-CM | POA: Diagnosis present

## 2016-01-18 HISTORY — DX: Dizziness and giddiness: R42

## 2016-01-18 LAB — CBC
HCT: 39.4 % (ref 36.0–46.0)
Hemoglobin: 13.2 g/dL (ref 12.0–15.0)
MCH: 30.9 pg (ref 26.0–34.0)
MCHC: 33.5 g/dL (ref 30.0–36.0)
MCV: 92.3 fL (ref 78.0–100.0)
PLATELETS: 256 10*3/uL (ref 150–400)
RBC: 4.27 MIL/uL (ref 3.87–5.11)
RDW: 13.3 % (ref 11.5–15.5)
WBC: 8.6 10*3/uL (ref 4.0–10.5)

## 2016-01-18 LAB — ABO/RH: ABO/RH(D): O POS

## 2016-01-18 LAB — SURGICAL PCR SCREEN
MRSA, PCR: NEGATIVE
Staphylococcus aureus: NEGATIVE

## 2016-01-18 NOTE — Progress Notes (Signed)
Medical clearance amy moon pa 12-23-15 on chart 12-19-15 ekg amy moon on chart Chest xray 12-19-15 on chart Echo 12-23-15 on chart

## 2016-01-25 ENCOUNTER — Inpatient Hospital Stay (HOSPITAL_COMMUNITY): Payer: Medicare Other | Admitting: Registered Nurse

## 2016-01-25 ENCOUNTER — Inpatient Hospital Stay (HOSPITAL_COMMUNITY)
Admission: RE | Admit: 2016-01-25 | Discharge: 2016-01-26 | DRG: 470 | Disposition: A | Discharge status: Home Health | Payer: Medicare Other | Source: Ambulatory Visit | Attending: Orthopedic Surgery | Admitting: Orthopedic Surgery

## 2016-01-25 ENCOUNTER — Encounter (HOSPITAL_COMMUNITY)
Admission: RE | Disposition: A | Discharge status: Home Health | Payer: Self-pay | Source: Ambulatory Visit | Attending: Orthopedic Surgery

## 2016-01-25 ENCOUNTER — Encounter (HOSPITAL_COMMUNITY): Payer: Self-pay | Admitting: *Deleted

## 2016-01-25 DIAGNOSIS — M1711 Unilateral primary osteoarthritis, right knee: Principal | ICD-10-CM | POA: Diagnosis present

## 2016-01-25 DIAGNOSIS — Z981 Arthrodesis status: Secondary | ICD-10-CM

## 2016-01-25 DIAGNOSIS — Z96659 Presence of unspecified artificial knee joint: Secondary | ICD-10-CM

## 2016-01-25 DIAGNOSIS — E039 Hypothyroidism, unspecified: Secondary | ICD-10-CM | POA: Diagnosis present

## 2016-01-25 DIAGNOSIS — M25561 Pain in right knee: Secondary | ICD-10-CM | POA: Diagnosis present

## 2016-01-25 DIAGNOSIS — M65861 Other synovitis and tenosynovitis, right lower leg: Secondary | ICD-10-CM | POA: Diagnosis present

## 2016-01-25 HISTORY — PX: TOTAL KNEE ARTHROPLASTY: SHX125

## 2016-01-25 HISTORY — DX: Presence of unspecified artificial knee joint: Z96.659

## 2016-01-25 LAB — TYPE AND SCREEN
ABO/RH(D): O POS
Antibody Screen: NEGATIVE

## 2016-01-25 SURGERY — ARTHROPLASTY, KNEE, TOTAL
Anesthesia: Monitor Anesthesia Care | Site: Knee | Laterality: Right

## 2016-01-25 MED ORDER — CELECOXIB 200 MG PO CAPS
200.0000 mg | ORAL_CAPSULE | Freq: Two times a day (BID) | ORAL | Status: DC
  Administered 2016-01-25 – 2016-01-26 (×3): 200 mg via ORAL
  Filled 2016-01-25 (×3): qty 1

## 2016-01-25 MED ORDER — MAGNESIUM CITRATE PO SOLN: 1.0000 | Freq: Once | ORAL | Status: DC | PRN

## 2016-01-25 MED ORDER — PROPOFOL 10 MG/ML IV BOLUS
INTRAVENOUS | Status: AC
  Filled 2016-01-25: qty 60

## 2016-01-25 MED ORDER — METHOCARBAMOL 1000 MG/10ML IJ SOLN
500.0000 mg | Freq: Four times a day (QID) | INTRAVENOUS | Status: DC | PRN
  Administered 2016-01-25: 500 mg via INTRAVENOUS
  Filled 2016-01-25: qty 550
  Filled 2016-01-25: qty 5

## 2016-01-25 MED ORDER — HYDROCODONE-ACETAMINOPHEN 7.5-325 MG PO TABS
1.0000 | ORAL_TABLET | ORAL | Status: DC
Start: 2016-01-25 — End: 2016-01-26
  Administered 2016-01-25 – 2016-01-26 (×8): 2 via ORAL
  Filled 2016-01-25 (×8): qty 2

## 2016-01-25 MED ORDER — KETOROLAC TROMETHAMINE 30 MG/ML IJ SOLN
INTRAMUSCULAR | Status: DC | PRN
  Administered 2016-01-25: 30 mg

## 2016-01-25 MED ORDER — BUPIVACAINE IN DEXTROSE 0.75-8.25 % IT SOLN
INTRATHECAL | Status: DC | PRN
  Administered 2016-01-25: 1.8 mL via INTRATHECAL

## 2016-01-25 MED ORDER — ALUM & MAG HYDROXIDE-SIMETH 200-200-20 MG/5ML PO SUSP: 30.0000 mL | ORAL | Status: DC | PRN

## 2016-01-25 MED ORDER — ONDANSETRON HCL 4 MG/2ML IJ SOLN: 4.0000 mg | Freq: Four times a day (QID) | INTRAMUSCULAR | Status: DC | PRN

## 2016-01-25 MED ORDER — LACTATED RINGERS IV SOLN
INTRAVENOUS | Status: DC | PRN
  Administered 2016-01-25 (×2): via INTRAVENOUS

## 2016-01-25 MED ORDER — PHENOL 1.4 % MT LIQD: 1.0000 | OROMUCOSAL | Status: DC | PRN

## 2016-01-25 MED ORDER — LIDOCAINE 2% (20 MG/ML) 5 ML SYRINGE
INTRAMUSCULAR | Status: AC
  Filled 2016-01-25: qty 5

## 2016-01-25 MED ORDER — FENTANYL CITRATE (PF) 100 MCG/2ML IJ SOLN
INTRAMUSCULAR | Status: AC
  Filled 2016-01-25: qty 2

## 2016-01-25 MED ORDER — FENTANYL CITRATE (PF) 100 MCG/2ML IJ SOLN
INTRAMUSCULAR | Status: DC | PRN
  Administered 2016-01-25 (×4): 50 ug via INTRAVENOUS

## 2016-01-25 MED ORDER — SODIUM CHLORIDE 0.9 % IR SOLN
Status: DC | PRN
  Administered 2016-01-25: 1000 mL

## 2016-01-25 MED ORDER — HYDROMORPHONE HCL 1 MG/ML IJ SOLN
0.5000 mg | INTRAMUSCULAR | Status: DC | PRN
  Administered 2016-01-25: 1 mg via INTRAVENOUS
  Filled 2016-01-25: qty 1

## 2016-01-25 MED ORDER — RALOXIFENE HCL 60 MG PO TABS
60.0000 mg | ORAL_TABLET | Freq: Every day | ORAL | Status: DC
  Administered 2016-01-25 – 2016-01-26 (×2): 60 mg via ORAL
  Filled 2016-01-25 (×2): qty 1

## 2016-01-25 MED ORDER — DOCUSATE SODIUM 100 MG PO CAPS
100.0000 mg | ORAL_CAPSULE | Freq: Two times a day (BID) | ORAL | Status: DC
  Administered 2016-01-25 – 2016-01-26 (×2): 100 mg via ORAL
  Filled 2016-01-25 (×2): qty 1

## 2016-01-25 MED ORDER — BUPIVACAINE HCL (PF) 0.25 % IJ SOLN
INTRAMUSCULAR | Status: DC | PRN
  Administered 2016-01-25: 30 mL

## 2016-01-25 MED ORDER — FENTANYL CITRATE (PF) 100 MCG/2ML IJ SOLN
25.0000 ug | INTRAMUSCULAR | Status: DC | PRN
  Administered 2016-01-25 (×3): 50 ug via INTRAVENOUS

## 2016-01-25 MED ORDER — CEFAZOLIN SODIUM-DEXTROSE 2-4 GM/100ML-% IV SOLN
2.0000 g | Freq: Four times a day (QID) | INTRAVENOUS | Status: AC
  Administered 2016-01-25 (×2): 2 g via INTRAVENOUS
  Filled 2016-01-25 (×2): qty 100

## 2016-01-25 MED ORDER — 0.9 % SODIUM CHLORIDE (POUR BTL) OPTIME
TOPICAL | Status: DC | PRN
  Administered 2016-01-25: 1000 mL

## 2016-01-25 MED ORDER — DIPHENHYDRAMINE HCL 25 MG PO CAPS: 25.0000 mg | ORAL_CAPSULE | Freq: Four times a day (QID) | ORAL | Status: DC | PRN

## 2016-01-25 MED ORDER — ONDANSETRON HCL 4 MG PO TABS: 4.0000 mg | ORAL_TABLET | Freq: Four times a day (QID) | ORAL | Status: DC | PRN

## 2016-01-25 MED ORDER — DEXAMETHASONE SODIUM PHOSPHATE 10 MG/ML IJ SOLN
INTRAMUSCULAR | Status: AC
Start: 2016-01-25 — End: 2016-01-25
  Filled 2016-01-25: qty 1

## 2016-01-25 MED ORDER — MENTHOL 3 MG MT LOZG: 1.0000 | LOZENGE | OROMUCOSAL | Status: DC | PRN

## 2016-01-25 MED ORDER — BUPIVACAINE HCL (PF) 0.25 % IJ SOLN
INTRAMUSCULAR | Status: AC
  Filled 2016-01-25: qty 30

## 2016-01-25 MED ORDER — SODIUM CHLORIDE 0.9 % IV SOLN
INTRAVENOUS | Status: DC
  Administered 2016-01-25 – 2016-01-26 (×2): via INTRAVENOUS
  Filled 2016-01-25 (×5): qty 1000

## 2016-01-25 MED ORDER — SODIUM CHLORIDE 0.9 % IJ SOLN
INTRAMUSCULAR | Status: DC | PRN
  Administered 2016-01-25: 50 mL

## 2016-01-25 MED ORDER — METOCLOPRAMIDE HCL 5 MG PO TABS: 5.0000 mg | ORAL_TABLET | Freq: Three times a day (TID) | ORAL | Status: DC | PRN

## 2016-01-25 MED ORDER — ONDANSETRON HCL 4 MG/2ML IJ SOLN
INTRAMUSCULAR | Status: AC
  Filled 2016-01-25: qty 2

## 2016-01-25 MED ORDER — CEFAZOLIN SODIUM-DEXTROSE 2-4 GM/100ML-% IV SOLN
INTRAVENOUS | Status: AC
  Filled 2016-01-25: qty 100

## 2016-01-25 MED ORDER — KETOROLAC TROMETHAMINE 30 MG/ML IJ SOLN
INTRAMUSCULAR | Status: AC
  Filled 2016-01-25: qty 1

## 2016-01-25 MED ORDER — LIDOCAINE 2% (20 MG/ML) 5 ML SYRINGE
INTRAMUSCULAR | Status: DC | PRN
  Administered 2016-01-25: 60 mg via INTRAVENOUS

## 2016-01-25 MED ORDER — CITALOPRAM HYDROBROMIDE 20 MG PO TABS
10.0000 mg | ORAL_TABLET | Freq: Every day | ORAL | Status: DC
  Administered 2016-01-25: 10 mg via ORAL
  Filled 2016-01-25: qty 1

## 2016-01-25 MED ORDER — OXYCODONE HCL 5 MG PO TABS: 5.0000 mg | ORAL_TABLET | Freq: Once | ORAL | Status: DC | PRN

## 2016-01-25 MED ORDER — METOCLOPRAMIDE HCL 5 MG/ML IJ SOLN: 5.0000 mg | Freq: Three times a day (TID) | INTRAMUSCULAR | Status: DC | PRN

## 2016-01-25 MED ORDER — ONDANSETRON HCL 4 MG/2ML IJ SOLN
INTRAMUSCULAR | Status: DC | PRN
  Administered 2016-01-25: 4 mg via INTRAVENOUS

## 2016-01-25 MED ORDER — FERROUS SULFATE 325 (65 FE) MG PO TABS
325.0000 mg | ORAL_TABLET | Freq: Three times a day (TID) | ORAL | Status: DC
  Administered 2016-01-25 – 2016-01-26 (×4): 325 mg via ORAL
  Filled 2016-01-25 (×4): qty 1

## 2016-01-25 MED ORDER — BISACODYL 10 MG RE SUPP: 10.0000 mg | Freq: Every day | RECTAL | Status: DC | PRN

## 2016-01-25 MED ORDER — METHOCARBAMOL 500 MG PO TABS
500.0000 mg | ORAL_TABLET | Freq: Four times a day (QID) | ORAL | Status: DC | PRN
  Filled 2016-01-25: qty 1

## 2016-01-25 MED ORDER — PROPOFOL 500 MG/50ML IV EMUL
INTRAVENOUS | Status: DC | PRN
  Administered 2016-01-25: 50 ug/kg/min via INTRAVENOUS

## 2016-01-25 MED ORDER — DEXAMETHASONE SODIUM PHOSPHATE 10 MG/ML IJ SOLN
10.0000 mg | Freq: Once | INTRAMUSCULAR | Status: AC
  Administered 2016-01-25: 10 mg via INTRAVENOUS

## 2016-01-25 MED ORDER — DEXAMETHASONE SODIUM PHOSPHATE 10 MG/ML IJ SOLN
10.0000 mg | Freq: Once | INTRAMUSCULAR | Status: AC
  Administered 2016-01-26: 10 mg via INTRAVENOUS
  Filled 2016-01-25: qty 1

## 2016-01-25 MED ORDER — OXYCODONE HCL 5 MG/5ML PO SOLN
5.0000 mg | Freq: Once | ORAL | Status: DC | PRN
  Filled 2016-01-25: qty 5

## 2016-01-25 MED ORDER — PROPOFOL 10 MG/ML IV BOLUS
INTRAVENOUS | Status: DC | PRN
  Administered 2016-01-25: 100 mg via INTRAVENOUS

## 2016-01-25 MED ORDER — ASPIRIN 81 MG PO CHEW
81.0000 mg | CHEWABLE_TABLET | Freq: Two times a day (BID) | ORAL | Status: DC
  Administered 2016-01-25 – 2016-01-26 (×2): 81 mg via ORAL
  Filled 2016-01-25 (×2): qty 1

## 2016-01-25 MED ORDER — LEVOTHYROXINE SODIUM 125 MCG PO TABS
125.0000 ug | ORAL_TABLET | Freq: Every day | ORAL | Status: DC
  Administered 2016-01-26: 125 ug via ORAL
  Filled 2016-01-25: qty 1

## 2016-01-25 MED ORDER — TRANEXAMIC ACID 1000 MG/10ML IV SOLN
1000.0000 mg | INTRAVENOUS | Status: AC
  Administered 2016-01-25: 1000 mg via INTRAVENOUS
  Filled 2016-01-25: qty 1100

## 2016-01-25 MED ORDER — CEFAZOLIN SODIUM-DEXTROSE 2-4 GM/100ML-% IV SOLN
2.0000 g | INTRAVENOUS | Status: AC
  Administered 2016-01-25: 2 g via INTRAVENOUS

## 2016-01-25 MED ORDER — SODIUM CHLORIDE 0.9 % IJ SOLN
INTRAMUSCULAR | Status: AC
  Filled 2016-01-25: qty 50

## 2016-01-25 MED ORDER — POLYETHYLENE GLYCOL 3350 17 G PO PACK
17.0000 g | PACK | Freq: Two times a day (BID) | ORAL | Status: DC
  Administered 2016-01-26: 17 g via ORAL
  Filled 2016-01-25 (×2): qty 1

## 2016-01-25 SURGICAL SUPPLY — 46 items
ADH SKN CLS APL DERMABOND .7 (GAUZE/BANDAGES/DRESSINGS) ×1
BAG DECANTER FOR FLEXI CONT (MISCELLANEOUS) IMPLANT
BAG SPEC THK2 15X12 ZIP CLS (MISCELLANEOUS)
BAG ZIPLOCK 12X15 (MISCELLANEOUS) IMPLANT
BANDAGE ACE 6X5 VEL STRL LF (GAUZE/BANDAGES/DRESSINGS) ×3 IMPLANT
BLADE SAW SGTL 13.0X1.19X90.0M (BLADE) ×3 IMPLANT
BOWL SMART MIX CTS (DISPOSABLE) ×3 IMPLANT
CAPT KNEE TOTAL 3 ATTUNE ×2 IMPLANT
CEMENT HV SMART SET (Cement) ×4 IMPLANT
CLOTH BEACON ORANGE TIMEOUT ST (SAFETY) ×3 IMPLANT
CUFF TOURN SGL QUICK 34 (TOURNIQUET CUFF) ×3
CUFF TRNQT CYL 34X4X40X1 (TOURNIQUET CUFF) ×1 IMPLANT
DECANTER SPIKE VIAL GLASS SM (MISCELLANEOUS) ×3 IMPLANT
DERMABOND ADVANCED (GAUZE/BANDAGES/DRESSINGS) ×2
DERMABOND ADVANCED .7 DNX12 (GAUZE/BANDAGES/DRESSINGS) ×1 IMPLANT
DRAPE U-SHAPE 47X51 STRL (DRAPES) ×3 IMPLANT
DRESSING AQUACEL AG SP 3.5X10 (GAUZE/BANDAGES/DRESSINGS) ×1 IMPLANT
DRSG AQUACEL AG SP 3.5X10 (GAUZE/BANDAGES/DRESSINGS) ×3
DURAPREP 26ML APPLICATOR (WOUND CARE) ×6 IMPLANT
ELECT REM PT RETURN 9FT ADLT (ELECTROSURGICAL) ×3
ELECTRODE REM PT RTRN 9FT ADLT (ELECTROSURGICAL) ×1 IMPLANT
GLOVE BIOGEL M 7.0 STRL (GLOVE) IMPLANT
GLOVE BIOGEL PI IND STRL 7.5 (GLOVE) ×1 IMPLANT
GLOVE BIOGEL PI IND STRL 8.5 (GLOVE) ×1 IMPLANT
GLOVE BIOGEL PI INDICATOR 7.5 (GLOVE) ×10
GLOVE BIOGEL PI INDICATOR 8.5 (GLOVE) ×2
GLOVE ECLIPSE 8.0 STRL XLNG CF (GLOVE) ×5 IMPLANT
GLOVE ORTHO TXT STRL SZ7.5 (GLOVE) ×6 IMPLANT
GOWN STRL REUS W/TWL LRG LVL3 (GOWN DISPOSABLE) ×5 IMPLANT
GOWN STRL REUS W/TWL XL LVL3 (GOWN DISPOSABLE) ×5 IMPLANT
HANDPIECE INTERPULSE COAX TIP (DISPOSABLE) ×3
MANIFOLD NEPTUNE II (INSTRUMENTS) ×3 IMPLANT
PACK TOTAL KNEE CUSTOM (KITS) ×3 IMPLANT
POSITIONER SURGICAL ARM (MISCELLANEOUS) ×3 IMPLANT
SET HNDPC FAN SPRY TIP SCT (DISPOSABLE) ×1 IMPLANT
SET PAD KNEE POSITIONER (MISCELLANEOUS) ×3 IMPLANT
SUT MNCRL AB 4-0 PS2 18 (SUTURE) ×3 IMPLANT
SUT VIC AB 1 CT1 36 (SUTURE) ×3 IMPLANT
SUT VIC AB 2-0 CT1 27 (SUTURE) ×9
SUT VIC AB 2-0 CT1 TAPERPNT 27 (SUTURE) ×3 IMPLANT
SUT VLOC 180 0 24IN GS25 (SUTURE) ×3 IMPLANT
SYR 50ML LL SCALE MARK (SYRINGE) ×1 IMPLANT
TRAY FOLEY CATH 14FR (SET/KITS/TRAYS/PACK) ×2 IMPLANT
WATER STERILE IRR 1500ML POUR (IV SOLUTION) ×5 IMPLANT
WRAP KNEE MAXI GEL POST OP (GAUZE/BANDAGES/DRESSINGS) ×3 IMPLANT
YANKAUER SUCT BULB TIP 10FT TU (MISCELLANEOUS) ×3 IMPLANT

## 2016-01-25 NOTE — Evaluation (Signed)
Physical Therapy Evaluation Patient Details Name: Jessica NorthJoan M Strother MRN: [id number] DOB: Aug 15, 1940 Today's Date: 01/25/2016   History of Present Illness  Pt is a 75 year old female s/p R TKA with hx of recent vestibular neuritis (end of October per pt)  Clinical Impression  Pt is s/p TKA resulting in the deficits listed below (see PT Problem List).  Pt will benefit from skilled PT to increase their independence and safety with mobility to allow discharge to the venue listed below.  Pt assisted with short distance ambulation POD #0 and plans to d/c home with family assist.  Pt reports vestibular neuritis diagnosis end of October and has been receiving vestibular rehab - currently working on ambulation with gaze stabilization exercises per pt description.  Pt dizzy with mobility however states does not feel vertigo related at this time.     Follow Up Recommendations Home health PT    Equipment Recommendations  None recommended by PT    Recommendations for Other Services       Precautions / Restrictions Precautions Precautions: Fall;Knee Restrictions Other Position/Activity Restrictions: WBAT      Mobility  Bed Mobility Overal bed mobility: Needs Assistance Bed Mobility: Supine to Sit     Supine to sit: Min assist;HOB elevated     General bed mobility comments: verbal cues for technique, assist for R LE  Transfers Overall transfer level: Needs assistance Equipment used: Rolling walker (2 wheeled) Transfers: Sit to/from Stand Sit to Stand: Min assist;From elevated surface         General transfer comment: verbal cues for UE and LE positioning, assist to rise and steady  Ambulation/Gait Ambulation/Gait assistance: Min assist Ambulation Distance (Feet): 18 Feet Assistive device: Rolling walker (2 wheeled) Gait Pattern/deviations: Step-to pattern;Decreased stance time - right;Antalgic     General Gait Details: verbal cues for sequence, RW positioning, posture, distance  to tolerance  Stairs            Wheelchair Mobility    Modified Rankin (Stroke Patients Only)       Balance                                             Pertinent Vitals/Pain Pain Assessment: 0-10 Pain Score: 3  Pain Location: R knee Pain Descriptors / Indicators: Aching;Sore Pain Intervention(s): Premedicated before session;Monitored during session;Limited activity within patient's tolerance;Repositioned;Ice applied    Home Living Family/patient expects to be discharged to:: Private residence Living Arrangements: Children Available Help at Discharge: Family Type of Home: House Home Access: Ramped entrance     Home Layout: One level;Laundry or work area in [employer]basement Home Equipment: Environmental consultantWalker - 2 wheels      Prior Function Level of Independence: Independent               Hand Dominance        Extremity/Trunk Assessment               Lower Extremity Assessment: RLE deficits/detail RLE Deficits / Details: unable to perform SLR, ROM TBA - painful today       Communication   Communication: No difficulties  Cognition Arousal/Alertness: Awake/alert Behavior During Therapy: WFL for tasks assessed/performed Overall Cognitive Status: Within Functional Limits for tasks assessed                      General Comments  Exercises     Assessment/Plan    PT Assessment Patient needs continued PT services  PT Problem List Decreased strength;Decreased activity tolerance;Pain;Decreased mobility;Decreased balance;Decreased range of motion          PT Treatment Interventions Functional mobility training;Gait training;DME instruction;Therapeutic activities;Therapeutic exercise;Patient/family education    PT Goals (Current goals can be found in the Care Plan section)  Acute Rehab PT Goals PT Goal Formulation: With patient Time For Goal Achievement: 01/29/16 Potential to Achieve Goals: Good    Frequency 7X/week    Barriers to discharge        Co-evaluation               End of Session Equipment Utilized During Treatment: Gait belt Activity Tolerance: Patient tolerated treatment well Patient left: in chair;with chair alarm set;with call bell/phone within reach Nurse Communication: Mobility status         Time: [id number] PT Time Calculation (min) (ACUTE ONLY): 24 min   Charges:   PT Evaluation $PT Eval Low Complexity: 1 Procedure     PT G Codes:        Erinn Mendosa,KATHrine E 01/25/2016, 3:03 PM Zenovia JarredKati Ruhaan Nordahl, PT, DPT 01/25/2016 Pager: [phone]

## 2016-01-25 NOTE — Transfer of Care (Signed)
Immediate Anesthesia Transfer of Care Note  Patient: Jessica [name]  Procedure(s) Performed: Procedure(s): RIGHT TOTAL KNEE ARTHROPLASTY (Right)  Patient Location: PACU  Anesthesia Type:General  Level of Consciousness:  sedated, patient cooperative and responds to stimulation  Airway & Oxygen Therapy:Patient Spontanous Breathing and Patient connected to face mask oxgen  Post-op Assessment:  Report given to PACU RN and Post -op Vital signs reviewed and stable  Post vital signs:  Reviewed and stable  Last Vitals:  Vitals:   01/25/16 0549  BP: (!) 154/75  Pulse: 63  Resp: 16  Temp: 36.4 C    Complications: No apparent anesthesia complications

## 2016-01-25 NOTE — Discharge Instructions (Signed)

## 2016-01-25 NOTE — Anesthesia Preprocedure Evaluation (Signed)
Anesthesia Evaluation  Patient identified by MRN, date of birth, ID band Patient awake    Reviewed: Allergy & Precautions, H&P , NPO status , Patient's Chart, lab work & pertinent test results  Airway Mallampati: II   Neck ROM: full    Dental   Pulmonary neg pulmonary ROS,    breath sounds clear to auscultation       Cardiovascular negative cardio ROS   Rhythm:regular Rate:Normal     Neuro/Psych    GI/Hepatic   Endo/Other  Hypothyroidism   Renal/GU      Musculoskeletal  (+) Arthritis ,   Abdominal   Peds  Hematology   Anesthesia Other Findings   Reproductive/Obstetrics                             Anesthesia Physical Anesthesia Plan  ASA: II  Anesthesia Plan: MAC and Spinal   Post-op Pain Management:    Induction: Intravenous  Airway Management Planned: Simple Face Mask  Additional Equipment:   Intra-op Plan:   Post-operative Plan:   Informed Consent: I have reviewed the patients History and Physical, chart, labs and discussed the procedure including the risks, benefits and alternatives for the proposed anesthesia with the patient or authorized representative who has indicated his/her understanding and acceptance.     Plan Discussed with: CRNA, Anesthesiologist and Surgeon  Anesthesia Plan Comments:         Anesthesia Quick Evaluation  

## 2016-01-25 NOTE — Anesthesia Postprocedure Evaluation (Signed)
Anesthesia Post Note  Patient: Jessica [name]  Procedure(s) Performed: Procedure(s) (LRB): RIGHT TOTAL KNEE ARTHROPLASTY (Right)  Patient location during evaluation: PACU Anesthesia Type: General Level of consciousness: awake and alert and patient cooperative Pain management: pain level controlled Vital Signs Assessment: post-procedure vital signs reviewed and stable Respiratory status: spontaneous breathing and respiratory function stable Cardiovascular status: stable Anesthetic complications: no    Last Vitals:  Vitals:   01/25/16 0948 01/25/16 1000  BP: (!) 146/87 (!) 164/74  Pulse: 69 86  Resp: 17 16  Temp:  37.1 C    Last Pain:  Vitals:   01/25/16 1010  TempSrc:   PainSc: 6                  Cross Jorge S

## 2016-01-25 NOTE — Interval H&P Note (Signed)
History and Physical Interval Note:  01/25/2016 7:04 AM  Jessica [name]  has presented today for surgery, with the diagnosis of RIGHT KNEE OA  The various methods of treatment have been discussed with the patient and family. After consideration of risks, benefits and other options for treatment, the patient has consented to  Procedure(s): RIGHT TOTAL KNEE ARTHROPLASTY (Right) as a surgical intervention .  The patient's history has been reviewed, patient examined, no change in status, stable for surgery.  I have reviewed the patient's chart and labs.  Questions were answered to the patient's satisfaction.     Shelda PalLIN,Azka Steger D

## 2016-01-25 NOTE — Anesthesia Procedure Notes (Signed)
Procedure Name: LMA Insertion Date/Time: 01/25/2016 7:38 AM Performed by: Jhonnie GarnerMARSHALL, Bess Saltzman M Pre-anesthesia Checklist: Patient identified, Emergency Drugs available, Suction available and Patient being monitored Patient Re-evaluated:Patient Re-evaluated prior to inductionOxygen Delivery Method: Circle system utilized Preoxygenation: Pre-oxygenation with 100% oxygen Intubation Type: IV induction Ventilation: Mask ventilation without difficulty LMA: LMA inserted LMA Size: 4.0 Number of attempts: 1 Placement Confirmation: positive ETCO2 and breath sounds checked- equal and bilateral Tube secured with: Tape Dental Injury: Teeth and Oropharynx as per pre-operative assessment

## 2016-01-25 NOTE — Op Note (Signed)
NAME:  Jessica NorthJoan M Poser                      MEDICAL RECORD NO.:  [id number]                             [facility]      PHYSICIAN:  Madlyn FrankelMatthew D. Charlann Boxerlin, M.D.  DATE OF BIRTH:  [birth year]      DATE OF PROCEDURE:  01/25/2016                                     OPERATIVE REPORT         PREOPERATIVE DIAGNOSIS:  Right knee osteoarthritis.      POSTOPERATIVE DIAGNOSIS:  Right knee osteoarthritis.      FINDINGS:  The patient was noted to have complete loss of cartilage and   bone-on-bone arthritis with associated osteophytes in the lateral and patellofemoral compartments of   the knee with a significant synovitis and associated effusion.      PROCEDURE:  Right total knee replacement.      COMPONENTS USED:  DePuy Attune rotating platform posterior stabilized knee   system, a size 5N femur, 6 tibia, size 8 PS AOX insert, and 35 anatomic patellar   button.      SURGEON:  Madlyn FrankelMatthew D. Charlann Boxerlin, M.D.      ASSISTANT:  Lanney GinsMatthew Babish, PA-C.      ANESTHESIA:  General and Spinal.      SPECIMENS:  None.      COMPLICATION:  None.      DRAINS:  None.  EBL: <150cc      TOURNIQUET TIME:   Total Tourniquet Time Documented: Thigh (Right) - 29 minutes Total: Thigh (Right) - 29 minutes  .      The patient was stable to the recovery room.      INDICATION FOR PROCEDURE:  Jessica [name] is a 75 y.o. female patient of   mine.  The patient had been seen, evaluated, and treated conservatively in the   office with medication, activity modification, and injections.  The patient had   radiographic changes of bone-on-bone arthritis with endplate sclerosis and osteophytes noted.      The patient failed conservative measures including medication, injections, and activity modification, and at this point was ready for more definitive measures.   Based on the radiographic changes and failed conservative measures, the patient   decided to proceed with total knee replacement.  Risks of infection,   DVT,  component failure, need for revision surgery, postop course, and   expectations were all   discussed and reviewed.  Consent was obtained for benefit of pain   relief.      PROCEDURE IN DETAIL:  The patient was brought to the operative theater.   Once adequate anesthesia, preoperative antibiotics, 2 gm of Ancef, 1 gm of Tranexamic Acid, and 10 mg of Decadron administered, the patient was positioned supine with the right thigh tourniquet placed.  The  right lower extremity was prepped and draped in sterile fashion.  A time-   out was performed identifying the patient, planned procedure, and   extremity.      The right lower extremity was placed in the [employer] leg holder.  The leg was   exsanguinated, tourniquet elevated to 250 mmHg.  A midline incision was  made followed by median parapatellar arthrotomy.  Following initial   exposure, attention was first directed to the patella.  Precut   measurement was noted to be 24 mm.  I resected down to 14 mm and used a   35 anatomic patellar button to restore patellar height as well as cover the cut   surface.      The lug holes were drilled and a metal shim was placed to protect the   patella from retractors and saw blades.      At this point, attention was now directed to the femur.  The femoral   canal was opened with a drill, irrigated to try to prevent fat emboli.  An   intramedullary rod was passed at 5 degrees valgus, 8 mm of bone was   resected off the distal femur due to pre-operative passive hyperextension.  Following this resection, the tibia was   subluxated anteriorly.  Using the extramedullary guide, 2 mm of bone was resected off   the proximal lateral tibia.  We confirmed the gap would be   stable medially and laterally with a size 6 spacer bloack as well as confirmed   the cut was perpendicular in the coronal plane, checking with an alignment rod.      Once this was done, I sized the femur to be a size 5 in the anterior-    posterior dimension, chose a narrow component based on medial and   lateral dimension.  The size 5 rotation block was then pinned in   position anterior referenced using the C-clamp to set rotation.  The   anterior, posterior, and  chamfer cuts were made without difficulty nor   notching making certain that I was along the anterior cortex to help   with flexion gap stability.      The final box cut was made off the lateral aspect of distal femur.      At this point, the tibia was sized to be a size 6, the size 6 tray was   then pinned in position through the medial third of the tubercle,   drilled, and keel punched.  Trial reduction was now carried with a 5 femur,  6 tibia, a size 6 then 8 PS insert, and the 35 anatomic patella botton.  The knee was brought to   extension, full extension with good flexion stability with the patella   tracking through the trochlea without application of pressure.  Given   all these findings I drilled the femoral lug holes and then the trial components removed.  Final components were   opened and cement was mixed.  The knee was irrigated with normal saline   solution and pulse lavage.  The synovial lining was   then injected with 30 cc 0.25% Marcaine without epinephrine and 1 cc of Toradol plus 30 cc of NS for a total of 61 cc.     The knee was irrigated.  Final implants were then cemented onto clean and   dried cut surfaces of bone with the knee brought to extension with a size 8 trial insert.      Once the cement had fully cured, the excess cement was removed   throughout the knee.  I confirmed I was satisfied with the range of   motion and stability, and the final size 8 PS AOX insert was chosen.  It was   placed into the knee.      The tourniquet had been let down  at 29 minutes.  No significant   hemostasis required.  The   extensor mechanism was then reapproximated using #1 Vicryl and #0 V-lock sutures with the knee   in flexion.  The   remaining  wound was closed with 2-0 Vicryl and running 4-0 Monocryl.   The knee was cleaned, dried, dressed sterilely using Dermabond and   Aquacel dressing. The patient was then   brought to recovery room in stable condition, tolerating the procedure   well.   Please note that Physician Assistant, Lanney GinsMatthew Babish, PA-C, was present for the entirety of the case, and was utilized for pre-operative positioning, peri-operative retractor management, general facilitation of the procedure.  He was also utilized for primary wound closure at the end of the case.              Madlyn FrankelMatthew D. Charlann Boxerlin, M.D.    01/25/2016 8:46 AM

## 2016-01-25 NOTE — Anesthesia Procedure Notes (Signed)
Spinal  Patient location during procedure: OR End time: 01/25/2016 7:23 AM Preanesthetic Checklist Completed: patient identified, site marked, surgical consent, pre-op evaluation, timeout performed, IV checked, risks and benefits discussed and monitors and equipment checked Spinal Block Patient position: sitting Prep: DuraPrep Patient monitoring: heart rate, cardiac monitor, continuous pulse ox and blood pressure Approach: midline Location: L3-4 Injection technique: single-shot Needle Needle type: Pencan  Needle gauge: 24 G Needle length: 9 cm Assessment Sensory level: T8 Additional Notes Single shot, good flow CSF, easy injection.

## 2016-01-26 LAB — CBC
HEMATOCRIT: 26 % — AB (ref 36.0–46.0)
HEMOGLOBIN: 8.8 g/dL — AB (ref 12.0–15.0)
MCH: 31.3 pg (ref 26.0–34.0)
MCHC: 33.8 g/dL (ref 30.0–36.0)
MCV: 92.5 fL (ref 78.0–100.0)
Platelets: 210 10*3/uL (ref 150–400)
RBC: 2.81 MIL/uL — AB (ref 3.87–5.11)
RDW: 13.2 % (ref 11.5–15.5)
WBC: 11.6 10*3/uL — AB (ref 4.0–10.5)

## 2016-01-26 LAB — BASIC METABOLIC PANEL
ANION GAP: 5 (ref 5–15)
BUN: 21 mg/dL — ABNORMAL HIGH (ref 6–20)
CALCIUM: 8.3 mg/dL — AB (ref 8.9–10.3)
CHLORIDE: 99 mmol/L — AB (ref 101–111)
CO2: 26 mmol/L (ref 22–32)
Creatinine, Ser: 0.84 mg/dL (ref 0.44–1.00)
GFR calc non Af Amer: >60 mL/min (ref >60)
Glucose, Bld: 112 mg/dL — ABNORMAL HIGH (ref 65–99)
Potassium: 4.4 mmol/L (ref 3.5–5.1)
Sodium: 130 mmol/L — ABNORMAL LOW (ref 135–145)

## 2016-01-26 MED ORDER — FERROUS SULFATE 325 (65 FE) MG PO TABS: 325.0000 mg | ORAL_TABLET | Freq: Three times a day (TID) | ORAL | 3 refills | Status: DC

## 2016-01-26 MED ORDER — POLYETHYLENE GLYCOL 3350 17 G PO PACK: 17.0000 g | PACK | Freq: Two times a day (BID) | ORAL | 0 refills | Status: DC

## 2016-01-26 MED ORDER — METHOCARBAMOL 500 MG PO TABS: 500.0000 mg | ORAL_TABLET | Freq: Four times a day (QID) | ORAL | 0 refills | Status: DC | PRN

## 2016-01-26 MED ORDER — ASPIRIN 81 MG PO CHEW: 81.0000 mg | CHEWABLE_TABLET | Freq: Two times a day (BID) | ORAL | 0 refills | Status: AC

## 2016-01-26 MED ORDER — CELECOXIB 200 MG PO CAPS: 200.0000 mg | ORAL_CAPSULE | Freq: Two times a day (BID) | ORAL | 0 refills | Status: DC

## 2016-01-26 MED ORDER — HYDROCODONE-ACETAMINOPHEN 7.5-325 MG PO TABS: 1.0000 | ORAL_TABLET | ORAL | 0 refills | Status: DC | PRN

## 2016-01-26 MED ORDER — DOCUSATE SODIUM 100 MG PO CAPS: 100.0000 mg | ORAL_CAPSULE | Freq: Two times a day (BID) | ORAL | 0 refills | Status: DC

## 2016-01-26 NOTE — Evaluation (Signed)
Occupational Therapy Evaluation Patient Details Name: Jessica NorthJoan M [name] MRN: [id number] DOB: 02/15/1941 Today's Date: 01/26/2016    History of Present Illness Pt is a 75 year old female s/p R TKA with hx of recent vestibular neuritis (end of October per pt)   Clinical Impression   Pt was admitted for the above. All education was completed. No further OT is needed at this time    Follow Up Recommendations  Supervision/Assistance - 24 hour    Equipment Recommendations  None recommended by OT    Recommendations for Other Services       Precautions / Restrictions Precautions Precautions: Fall;Knee Restrictions Weight Bearing Restrictions: No Other Position/Activity Restrictions: WBAT      Mobility Bed Mobility by PT Overal bed mobility: Needs Assistance Bed Mobility: Supine to Sit     Supine to sit: HOB elevated;Min guard     General bed mobility comments: oob  Transfers Overall transfer level: Needs assistance Equipment used: Rolling walker (2 wheeled) Transfers: Sit to/from Stand Sit to Stand: Min guard         General transfer comment: cues for UE placement    Balance                                            ADL Overall ADL's : Needs assistance/impaired     Grooming: Oral care;Min guard;Standing   Upper Body Bathing: Set up;Sitting   Lower Body Bathing: Minimal assistance;Sit to/from stand   Upper Body Dressing : Set up;Sitting   Lower Body Dressing: Minimal assistance;Sit to/from stand;With adaptive equipment   Toilet Transfer: Min guard;Ambulation;BSC;RW   Toileting- ArchitectClothing Manipulation and Hygiene: Min guard;Sit to/from stand   Tub/ Shower Transfer: Walk-in shower;Min guard;Ambulation     General ADL Comments: used reacher to don underwear. Pt has this at home. She also has a sock aide.  Practiced bathroom transfers.  No dizziness but min guard for safety.  Pt plans to wash at sink initially.  Son will assist her as  needed     Vision     Perception     Praxis      Pertinent Vitals/Pain Pain Assessment: 0-10 Pain Score: 3  Pain Location: R knee Pain Descriptors / Indicators: Aching Pain Intervention(s): Limited activity within patient's tolerance;Monitored during session;Premedicated before session;Repositioned;Ice applied     Hand Dominance     Extremity/Trunk Assessment Upper Extremity Assessment Upper Extremity Assessment: Overall WFL for tasks assessed           Communication Communication Communication: No difficulties   Cognition Arousal/Alertness: Awake/alert Behavior During Therapy: WFL for tasks assessed/performed Overall Cognitive Status: Within Functional Limits for tasks assessed                     General Comments       Exercises       Shoulder Instructions      Home Living Family/patient expects to be discharged to:: Private residence Living Arrangements: Children Available Help at Discharge: Family               Bathroom Shower/Tub: Walk-in Soil scientistshower   Bathroom Toilet: Handicapped height     Home Equipment: Bedside commode;Shower seat          Prior Functioning/Environment Level of Independence: Independent        Comments: son will assist her but she wants to be  as I as possible        OT Problem List:     OT Treatment/Interventions:      OT Goals(Current goals can be found in the care plan section) Acute Rehab OT Goals Patient Stated Goal: be able to walk for leisure OT Goal Formulation: All assessment and education complete, DC therapy  OT Frequency:     Barriers to D/C:            Co-evaluation              End of Session    Activity Tolerance: Patient tolerated treatment well Patient left: in chair;with call bell/phone within reach;with chair alarm set   Time: 1125-1204 OT Time Calculation (min): 39 min Charges:  OT General Charges $OT Visit: 1 Procedure OT Evaluation $OT Eval Low Complexity: 1  Procedure OT Treatments $Self Care/Home Management : 8-22 mins G-Codes:    Jaymir Struble 01/26/2016, 12:21 PM  Marica OtterMaryellen Aziah Kaiser, OTR/L [phone] 01/26/2016

## 2016-01-26 NOTE — Progress Notes (Signed)
   01/26/16 1400  PT Visit Information  Last PT Received On 01/26/16  Assistance Needed +1  History of Present Illness Pt is a 75 year old female s/p R TKA with hx of recent vestibular neuritis (end of October per pt)  Subjective Data  Subjective Pt ambulated again in hallway and reviewed transfers with son present.  Pt feels ready for d/c home today.  Also provided HEP handout and reviewed with pt.  Precautions  Precautions Fall;Knee  Restrictions  Other Position/Activity Restrictions WBAT  Pain Assessment  Pain Assessment 0-10  Pain Score 3  Pain Location R knee  Pain Descriptors / Indicators Aching;Sore  Pain Intervention(s) Limited activity within patient's tolerance;Monitored during session;Repositioned  Cognition  Arousal/Alertness Awake/alert  Behavior During Therapy WFL for tasks assessed/performed  Overall Cognitive Status Within Functional Limits for tasks assessed  Transfers  Overall transfer level Needs assistance  Equipment used Rolling walker (2 wheeled)  Transfers Sit to/from Stand  Sit to Stand Min guard  General transfer comment cues for UE placement  Ambulation/Gait  Ambulation/Gait assistance Min guard  Ambulation Distance (Feet) 100 Feet  Assistive device Rolling walker (2 wheeled)  Gait Pattern/deviations Step-to pattern;Antalgic;Decreased stance time - right  General Gait Details verbal cues for sequence, RW positioning, posture, distance to tolerance  PT - End of Session  Activity Tolerance Patient tolerated treatment well  Patient left in chair;with call bell/phone within reach;with chair alarm set;with family/visitor present  PT - Assessment/Plan  PT Plan Current plan remains appropriate  PT Frequency (ACUTE ONLY) 7X/week  Follow Up Recommendations Home health PT  PT equipment None recommended by PT  PT Goal Progression  Progress towards PT goals Progressing toward goals  PT Time Calculation  PT Start Time (ACUTE ONLY) 1351  PT Stop Time (ACUTE  ONLY) 1405  PT Time Calculation (min) (ACUTE ONLY) 14 min  PT General Charges  $$ ACUTE PT VISIT 1 Procedure  PT Treatments  $Gait Training 8-22 mins   Zenovia JarredKati Neylan Koroma, PT, DPT 01/26/2016 Pager: [phone]

## 2016-01-26 NOTE — Care Management Note (Signed)
Case Management Note  Patient Details  Name: INFANT DOANE MRN: [id number] Date of Birth: 1940/08/06  Subjective/Objective:                  RIGHT TOTAL KNEE ARTHROPLASTY (Right) Action/Plan: Discharge planning Expected Discharge Date:  01/26/16               Expected Discharge Plan:  Home/Self Care  In-House Referral:     Discharge planning Services  CM Consult  Post Acute Care Choice:  NA Choice offered to:     DME Arranged:  N/A DME Agency:  NA  HH Arranged:  NA HH Agency:  NA  Status of Service:  Completed, signed off  If discussed at [place] of Stay Meetings, dates discussed:    Additional Comments: CM met with pt in room to confirm plan is for outpt PT; Pt confirms as does documentation of MD.  Pt states she has all needed DME at home.  Son of pt will provide transportation home.  NO other CM needs were communicated. Dellie Catholic, RN 01/26/2016, 1:01 PM

## 2016-01-26 NOTE — Progress Notes (Signed)
Patient ID: Jessica [name], female   DOB: 1940-08-31, 75 y.o.   MRN: [id number]   Subjective: 1 Day Post-Op Procedure(s) (LRB): RIGHT TOTAL KNEE ARTHROPLASTY (Right)    Patient reports pain as mild to moderate.  No events, doing quite well  Objective:   VITALS:   Vitals:   01/26/16 0238 01/26/16 0557  BP: (!) 109/58 (!) 109/55  Pulse: 67 60  Resp: 16 16  Temp: 98.7 F (37.1 C) 98.3 F (36.8 C)    Neurovascular intact Incision: dressing C/D/I  LABS  Recent Labs  01/26/16 0506  HGB 8.8*  HCT 26.0*  WBC 11.6*  PLT 210     Recent Labs  01/26/16 0506  NA 130*  K 4.4  BUN 21*  CREATININE 0.84  GLUCOSE 112*    No results for input(s): LABPT, INR in the last 72 hours.   Assessment/Plan: 1 Day Post-Op Procedure(s) (LRB): RIGHT TOTAL KNEE ARTHROPLASTY (Right)   Advance diet Up with therapy Discharge home today with plans to start outpatient PT this week - Rx at our office

## 2016-01-26 NOTE — Progress Notes (Signed)
Physical Therapy Treatment Patient Details Name: Jessica NorthJoan M Compere MRN: [id number] DOB: 1940-10-18 Today's Date: 01/26/2016    History of Present Illness Pt is a 75 year old female s/p R TKA with hx of recent vestibular neuritis (end of October per pt)    PT Comments    Pt assisted with ambulating hallway and performed LE exercises.  Pt reports her son is on the way and she would like him to be here later when she mobilizes again.  Pt reports her memory isn't as sharp as it once was and she would like him to hear cues.   Follow Up Recommendations  Home health PT     Equipment Recommendations  None recommended by PT    Recommendations for Other Services       Precautions / Restrictions Precautions Precautions: Fall;Knee Restrictions Weight Bearing Restrictions: No Other Position/Activity Restrictions: WBAT    Mobility  Bed Mobility Overal bed mobility: Needs Assistance Bed Mobility: Supine to Sit     Supine to sit: HOB elevated;Min guard     General bed mobility comments: pt self assisted R LE  Transfers Overall transfer level: Needs assistance Equipment used: Rolling walker (2 wheeled) Transfers: Sit to/from Stand Sit to Stand: From elevated surface;Min guard         General transfer comment: verbal cues for UE and LE positioning  Ambulation/Gait Ambulation/Gait assistance: Min guard Ambulation Distance (Feet): 70 Feet Assistive device: Rolling walker (2 wheeled) Gait Pattern/deviations: Step-to pattern;Decreased stance time - right;Antalgic     General Gait Details: verbal cues for sequence, RW positioning, posture, distance to tolerance   Stairs            Wheelchair Mobility    Modified Rankin (Stroke Patients Only)       Balance                                    Cognition Arousal/Alertness: Awake/alert Behavior During Therapy: WFL for tasks assessed/performed Overall Cognitive Status: Within Functional Limits for tasks  assessed                      Exercises Total Joint Exercises Ankle Circles/Pumps: AROM;Both;10 reps Quad Sets: AROM;Both;10 reps Short Arc Quad: AROM;Right;10 reps Heel Slides: AAROM;Right;10 reps Hip ABduction/ADduction: AROM;Right;10 reps Straight Leg Raises: AAROM;Right;10 reps Goniometric ROM: approx 85* R knee flexion AAROM    General Comments        Pertinent Vitals/Pain Pain Assessment: 0-10 Pain Score: 3  Pain Location: R knee Pain Descriptors / Indicators: Aching;Sore Pain Intervention(s): Limited activity within patient's tolerance;Monitored during session;Repositioned;Ice applied    Home Living                      Prior Function            PT Goals (current goals can now be found in the care plan section) Progress towards PT goals: Progressing toward goals    Frequency    7X/week      PT Plan Current plan remains appropriate    Co-evaluation             End of Session Equipment Utilized During Treatment: Gait belt Activity Tolerance: Patient tolerated treatment well Patient left: in chair;with chair alarm set;with call bell/phone within reach     Time: 1007-1034 PT Time Calculation (min) (ACUTE ONLY): 27 min  Charges:  $Gait Training: 8-22  mins $Therapeutic Exercise: 8-22 mins                    G Codes:      Kathyleen Radice,KATHrine E 01/26/2016, 12:00 PM Zenovia JarredKati Ismaeel Arvelo, PT, DPT 01/26/2016 Pager: [phone]

## 2016-01-27 NOTE — Discharge Summary (Signed)
Physician Discharge Summary  Patient ID: Jessica [name] MRN: [id number] DOB/AGE: 05-14-1940 75 y.o.  Admit date: 01/25/2016 Discharge date: 01/26/2016   Procedures:  Procedure(s) (LRB): RIGHT TOTAL KNEE ARTHROPLASTY (Right)  Attending Physician:  Dr. Durene RomansMatthew Olin   Admission Diagnoses:     Right knee primary OA / pain  Discharge Diagnoses:  Active Problems:   S/P knee replacement  Past Medical History:  Diagnosis Date  . Arthritis    oa  . Dizziness    related seeing vestibular md for inner ear  . Hypothyroidism     HPI:    Jessica NorthJoan M Brosch, 75 y.o. female, has a history of pain and functional disability in the right knee due to arthritis and has failed non-surgical conservative treatments for greater than 12 weeks to include NSAID's and/or analgesics, corticosteriod injections, viscosupplementation injections, use of assistive devices and activity modification.  Onset of symptoms was gradual, starting >10 years ago with gradually worsening course since that time. The patient noted no past surgery on the right knee(s).  Patient currently rates pain in the right knee(s) at 8 out of 10 with activity. Patient has night pain, worsening of pain with activity and weight bearing, pain that interferes with activities of daily living, pain with passive range of motion, crepitus and joint swelling.  Patient has evidence of periarticular osteophytes and joint space narrowing by imaging studies.  There is no active infection.   Risks, benefits and expectations were discussed with the patient.  Risks including but not limited to the risk of anesthesia, blood clots, nerve damage, blood vessel damage, failure of the prosthesis, infection and up to and including death.  Patient understand the risks, benefits and expectations and wishes to proceed with surgery.   PCP: Gaye AlkenMOON,AMY, NP   Discharged Condition: good  Hospital Course:  Patient underwent the above stated procedure on 01/25/2016. Patient  tolerated the procedure well and brought to the recovery room in good condition and subsequently to the floor.  POD #1 BP: 109/55 ; Pulse: 60 ; Temp: 98.3 F (36.8 C) ; Resp: 16 Patient reports pain as mild to moderate.  No events, doing quite well. Neurovascular intact and incision: dressing C/D/I.  LABS  Basename    HGB     8.8  HCT     26.0    Discharge Exam: General appearance: alert, cooperative and no distress Extremities: Homans sign is negative, no sign of DVT, no edema, redness or tenderness in the calves or thighs and no ulcers, gangrene or trophic changes  Disposition: Home with follow up in 2 weeks   Follow-up Information    Shelda PalLIN,Jaimere Feutz D, MD. Schedule an appointment as soon as possible for a visit in 2 week(s).   Specialty:  Orthopedic Surgery Contact information: [address] [place] [id number] [phone]           Discharge Instructions    Call MD / Call 911    Complete by:  As directed    If you experience chest pain or shortness of breath, CALL 911 and be transported to the hospital emergency room.  If you develope a fever above 101 F, pus (white drainage) or increased drainage or redness at the wound, or calf pain, call your surgeon's office.   Change dressing    Complete by:  As directed    Maintain surgical dressing until follow up in the clinic. If the edges start to pull up, may reinforce with tape. If the dressing is  no longer working, may remove and cover with gauze and tape, but must keep the area dry and clean.  Call with any questions or concerns.   Constipation Prevention    Complete by:  As directed    Drink plenty of fluids.  Prune juice may be helpful.  You may use a stool softener, such as Colace (over the counter) 100 mg twice a day.  Use MiraLax (over the counter) for constipation as needed.   Diet - low sodium heart healthy    Complete by:  As directed    Discharge instructions    Complete by:  As directed     Maintain surgical dressing until follow up in the clinic. If the edges start to pull up, may reinforce with tape. If the dressing is no longer working, may remove and cover with gauze and tape, but must keep the area dry and clean.  Follow up in 2 weeks at [facility]. Call with any questions or concerns.   Increase activity slowly as tolerated    Complete by:  As directed    Weight bearing as tolerated with assist device (walker, cane, etc) as directed, use it as long as suggested by your surgeon or therapist, typically at least 4-6 weeks.   TED hose    Complete by:  As directed    Use stockings (TED hose) for 2 weeks on both leg(s).  You may remove them at night for sleeping.        Medication List    STOP taking these medications   ibuprofen 200 MG tablet Commonly known as:  ADVIL,MOTRIN     TAKE these medications   aspirin 81 MG chewable tablet Chew 1 tablet (81 mg total) by mouth 2 (two) times daily. Take for 4 weeks.   CALCIUM 500+D PO Take 1 tablet by mouth daily.   celecoxib 200 MG capsule Commonly known as:  CELEBREX Take 1 capsule (200 mg total) by mouth every 12 (twelve) hours. Take along with Pepcid to avoid Gi upset.   citalopram 10 MG tablet Commonly known as:  CELEXA Take 10 mg by mouth at bedtime.   docusate sodium 100 MG capsule Commonly known as:  COLACE Take 1 capsule (100 mg total) by mouth 2 (two) times daily.   ergocalciferol 50000 units capsule Commonly known as:  VITAMIN D2 Take 50,000 Units by mouth once a week. Due on 10/15   ferrous sulfate 325 (65 FE) MG tablet Take 1 tablet (325 mg total) by mouth 3 (three) times daily after meals.   HYDROcodone-acetaminophen 7.5-325 MG tablet Commonly known as:  NORCO Take 1-2 tablets by mouth every 4 (four) hours as needed for moderate pain.   levothyroxine 125 MCG tablet Commonly known as:  SYNTHROID, LEVOTHROID Take 125 mcg by mouth daily. In the middle of the night   methocarbamol 500  MG tablet Commonly known as:  ROBAXIN Take 1 tablet (500 mg total) by mouth every 6 (six) hours as needed for muscle spasms.   multivitamin with minerals Tabs tablet Take 7 tablets by mouth daily. Shacklee Multivitamin regimen   OSTEO BI-FLEX REGULAR STRENGTH PO Take 2 tablets by mouth daily.   polyethylene glycol packet Commonly known as:  MIRALAX / GLYCOLAX Take 17 g by mouth 2 (two) times daily.   raloxifene 60 MG tablet Commonly known as:  EVISTA Take 60 mg by mouth daily. In the middle of the night        Signed: Anastasio Auerbach. Melissia Lahman  PA-C  01/27/2016, 9:38 AM

## 2016-03-11 ENCOUNTER — Other Ambulatory Visit: Payer: Self-pay | Admitting: Internal Medicine

## 2016-03-11 DIAGNOSIS — N6489 Other specified disorders of breast: Secondary | ICD-10-CM

## 2016-04-13 ENCOUNTER — Ambulatory Visit
Admission: RE | Admit: 2016-04-13 | Discharge: 2016-04-13 | Disposition: A | Discharge status: Home / Self Care | Payer: Medicare Other | Source: Ambulatory Visit | Attending: Internal Medicine | Admitting: Internal Medicine

## 2016-04-13 DIAGNOSIS — N6489 Other specified disorders of breast: Secondary | ICD-10-CM

## 2016-09-19 ENCOUNTER — Other Ambulatory Visit: Payer: Self-pay | Admitting: Internal Medicine

## 2016-09-19 DIAGNOSIS — Z1231 Encounter for screening mammogram for malignant neoplasm of breast: Secondary | ICD-10-CM

## 2016-10-04 ENCOUNTER — Ambulatory Visit
Admission: RE | Admit: 2016-10-04 | Discharge: 2016-10-04 | Disposition: A | Discharge status: Home / Self Care | Payer: Medicare Other | Source: Ambulatory Visit | Attending: Internal Medicine | Admitting: Internal Medicine

## 2016-10-04 DIAGNOSIS — Z1231 Encounter for screening mammogram for malignant neoplasm of breast: Secondary | ICD-10-CM

## 2017-06-07 DIAGNOSIS — S12101A Unspecified nondisplaced fracture of second cervical vertebra, initial encounter for closed fracture: Secondary | ICD-10-CM

## 2017-06-07 HISTORY — DX: Unspecified nondisplaced fracture of second cervical vertebra, initial encounter for closed fracture: S12.101A

## 2017-07-25 DIAGNOSIS — S129XXA Fracture of neck, unspecified, initial encounter: Secondary | ICD-10-CM

## 2017-07-25 DIAGNOSIS — R55 Syncope and collapse: Secondary | ICD-10-CM | POA: Diagnosis not present

## 2017-07-25 DIAGNOSIS — E039 Hypothyroidism, unspecified: Secondary | ICD-10-CM

## 2017-07-25 DIAGNOSIS — S72142A Displaced intertrochanteric fracture of left femur, initial encounter for closed fracture: Secondary | ICD-10-CM

## 2017-07-26 DIAGNOSIS — S129XXA Fracture of neck, unspecified, initial encounter: Secondary | ICD-10-CM | POA: Diagnosis not present

## 2017-07-26 DIAGNOSIS — E039 Hypothyroidism, unspecified: Secondary | ICD-10-CM | POA: Diagnosis not present

## 2017-07-26 DIAGNOSIS — M810 Age-related osteoporosis without current pathological fracture: Secondary | ICD-10-CM | POA: Diagnosis not present

## 2017-07-26 DIAGNOSIS — S72142A Displaced intertrochanteric fracture of left femur, initial encounter for closed fracture: Secondary | ICD-10-CM | POA: Diagnosis not present

## 2017-07-26 DIAGNOSIS — R55 Syncope and collapse: Secondary | ICD-10-CM | POA: Diagnosis not present

## 2017-07-27 DIAGNOSIS — S129XXA Fracture of neck, unspecified, initial encounter: Secondary | ICD-10-CM | POA: Diagnosis not present

## 2017-07-27 DIAGNOSIS — R55 Syncope and collapse: Secondary | ICD-10-CM | POA: Diagnosis not present

## 2017-07-27 DIAGNOSIS — E039 Hypothyroidism, unspecified: Secondary | ICD-10-CM | POA: Diagnosis not present

## 2017-07-27 DIAGNOSIS — S72142A Displaced intertrochanteric fracture of left femur, initial encounter for closed fracture: Secondary | ICD-10-CM | POA: Diagnosis not present

## 2017-07-28 DIAGNOSIS — E039 Hypothyroidism, unspecified: Secondary | ICD-10-CM | POA: Diagnosis not present

## 2017-07-28 DIAGNOSIS — S129XXA Fracture of neck, unspecified, initial encounter: Secondary | ICD-10-CM | POA: Diagnosis not present

## 2017-07-28 DIAGNOSIS — S72142A Displaced intertrochanteric fracture of left femur, initial encounter for closed fracture: Secondary | ICD-10-CM | POA: Diagnosis not present

## 2017-07-28 DIAGNOSIS — R55 Syncope and collapse: Secondary | ICD-10-CM | POA: Diagnosis not present

## 2017-08-21 ENCOUNTER — Ambulatory Visit: Payer: Medicare Other | Admitting: Cardiology

## 2017-08-21 ENCOUNTER — Encounter: Payer: Self-pay | Admitting: Cardiology

## 2017-08-21 VITALS — BP 130/78 | HR 75 | Ht 68.5 in | Wt 167.4 lb

## 2017-08-21 DIAGNOSIS — E785 Hyperlipidemia, unspecified: Secondary | ICD-10-CM

## 2017-08-21 DIAGNOSIS — E1121 Type 2 diabetes mellitus with diabetic nephropathy: Secondary | ICD-10-CM

## 2017-08-21 DIAGNOSIS — E039 Hypothyroidism, unspecified: Secondary | ICD-10-CM | POA: Insufficient documentation

## 2017-08-21 DIAGNOSIS — R55 Syncope and collapse: Secondary | ICD-10-CM | POA: Insufficient documentation

## 2017-08-21 DIAGNOSIS — E119 Type 2 diabetes mellitus without complications: Secondary | ICD-10-CM

## 2017-08-21 HISTORY — DX: Hyperlipidemia, unspecified: E78.5

## 2017-08-21 HISTORY — DX: Type 2 diabetes mellitus with diabetic nephropathy: E11.21

## 2017-08-21 HISTORY — DX: Syncope and collapse: R55

## 2017-08-21 MED ORDER — ASPIRIN EC 81 MG PO TBEC: 81.0000 mg | DELAYED_RELEASE_TABLET | Freq: Every day | ORAL | 3 refills | Status: DC

## 2017-08-21 NOTE — Patient Instructions (Addendum)
Medication Instructions:  Your physician has recommended you make the following change in your medication:  DECREASE aspirin 81 mg daily   Labwork: Your physician recommends that you return for lab work today: CBC, BMP, TSH.   Testing/Procedures: You had an EKG today.   Your physician has recommended that you wear an event monitor. Event monitors are medical devices that record the heart's electrical activity. Doctors most often us these monitors to diagnose arrhythmias. Arrhythmias are problems with the speed or rhythm of the heartbeat. The monitor is a small, portable device. You can wear one while you do your normal daily activities. This is usually used to diagnose what is causing palpitations/syncope (passing out). Wear for 30 days.  Follow-Up: Your physician wants you to follow-up in: 3 months. You will receive a reminder letter in the mail two months in advance. If you don't receive a letter, please call our office to schedule the follow-up appointment.   If you need a refill on your cardiac medications before your next appointment, please call your pharmacy.   Thank you for choosing [facility]! Mady Gemmaatherine Makinsley Schiavi, RN [phone]

## 2017-08-21 NOTE — Progress Notes (Signed)
Cardiology Office Note:    Date:  08/21/2017   ID:  Jessica [name], DOB 18-Dec-1940, MRN [id number]  PCP:  Hurshel Party, NP  Cardiologist:  Garwin Brothers, MD   Referring MD: Hurshel Party, NP    ASSESSMENT:    1. Syncope, unspecified syncope type   2. Hypothyroidism, unspecified type    PLAN:    In order of problems listed above:  1. I reviewed hospital records extensively and discussed with the patient at length.  Following recommendations were made. 2. Fall precautions were explained to the patient.  I reviewed records from [place] [facility].  She has been anemic for a long.  Of time and we will get a repeat hemoglobin level.  I also will get a Chem-7 in the TSH level because the last one was abnormal.  Echocardiogram has been done and turned off and was unremarkable.  In view of her syncopal episodes I asked her to keep herself well-hydrated and increase sodium in her diet to some extent.  She will undergo a one-month event monitoring to see if she has any arrhythmias that may be causing the symptoms. 3. Patient will be seen in follow-up appointment in 3 months or earlier if the patient has any concerns    Medication Adjustments/Labs and Tests Ordered: Current medicines are reviewed at length with the patient today.  Concerns regarding medicines are outlined above.  No orders of the defined types were placed in this encounter.  No orders of the defined types were placed in this encounter.    Chief Complaint  Patient presents with  . Loss of Consciousness    2 seprate episodes     History of Present Illness:    Jessica [name] is a 77 y.o. female.  Patient has had history of syncopal episodes twice in the past several months and that is why she is referred here.  No chest pain orthopnea or PND.  She mentions to me that she hurt her neck and had a fracture in second cervical vertebra.  She is wearing a collar for this and is followed by neurosurgeon.  She also fell another time  and broke her hip which has been repaired.  She is here for this evaluation.  She denies any chest pain orthopnea or PND.  At the time of my evaluation, the patient is alert awake oriented and in no distress.  Past Medical History:  Diagnosis Date  . Arthritis    oa  . Dizziness    related seeing vestibular md for inner ear  . Hypothyroidism     Past Surgical History:  Procedure Laterality Date  . ANTERIOR CERVICAL DECOMP/DISCECTOMY FUSION  12/13/2011   Procedure: ANTERIOR CERVICAL DECOMPRESSION/DISCECTOMY FUSION 3 LEVELS;  Surgeon: Temple Pacini, MD;  Location: [facility];  Service: Neurosurgery;  Laterality: N/A;  Cervical Five-Six Cervical Six-Seven Cervical Seven-Thoracic One Anterior cervical decompression/diskectomy/fusion/plating/allograft  . BREAST BIOPSY Right 2000  . CARPAL TUNNEL RELEASE     right  . EYE SURGERY     cataract bilateral  . FOOT ARTHRODESIS Left 06/25/2015   Procedure: HALLUX INTERPHALANGEAL JOINT  ARTHRODESIS;  Surgeon: Toni Arthurs, MD;  Location: [facility];  Service: Orthopedics;  Laterality: Left;  Marland Kitchen GASTROCNEMIUS RECESSION Left 06/25/2015   Procedure: LEFT GASTROC RECESSION ;  Surgeon: Toni Arthurs, MD;  Location: [facility];  Service: Orthopedics;  Laterality: Left;  . HIP FRACTURE SURGERY Left   . TENDON REPAIR Left 06/25/2015  Procedure: LEFT ANTERIOR TIBIAL TENDON RECONSTRUCTION;  EXTENSOR HALLUCIS LONGUS TRANSFER ;  Surgeon: Toni ArthursJohn Hewitt, MD;  Location: [facility];  Service: Orthopedics;  Laterality: Left;  . TOTAL KNEE ARTHROPLASTY Right 01/25/2016   Procedure: RIGHT TOTAL KNEE ARTHROPLASTY;  Surgeon: Durene RomansMatthew Olin, MD;  Location: [facility];  Service: Orthopedics;  Laterality: Right;  . TUBAL LIGATION      Current Medications: Current Meds  Medication Sig  . aspirin 325 MG EC tablet Take 325 mg by mouth 2 (two) times daily.  . Calcium Carb-Cholecalciferol (CALCIUM 500+D PO) Take 1 tablet by mouth daily.  .  citalopram (CELEXA) 10 MG tablet Take 10 mg by mouth at bedtime.  . ergocalciferol (VITAMIN D2) 50000 UNITS capsule Take 50,000 Units by mouth once a week. Due on 10/15   . levothyroxine (SYNTHROID, LEVOTHROID) 125 MCG tablet Take 125 mcg by mouth daily. In the middle of the night  . Multiple Vitamin (MULTIVITAMIN WITH MINERALS) TABS tablet Take 7 tablets by mouth daily. Shacklee Multivitamin regimen  . omega-3 fish oil (MAXEPA) 1000 MG CAPS capsule Take 2 capsules by mouth daily.  Marland Kitchen. oxyCODONE (OXY IR/ROXICODONE) 5 MG immediate release tablet Take 1 tablet by mouth every 6 (six) hours as needed.  . raloxifene (EVISTA) 60 MG tablet Take 60 mg by mouth daily. In the middle of the night     Allergies:   Patient has no known allergies.   Social History   Socioeconomic History  . Marital status: Widowed    Spouse name: Not on file  . Number of children: Not on file  . Years of education: Not on file  . Highest education level: Not on file  Occupational History  . Not on file  Social Needs  . Financial resource strain: Not on file  . Food insecurity:    Worry: Not on file    Inability: Not on file  . Transportation needs:    Medical: Not on file    Non-medical: Not on file  Tobacco Use  . Smoking status: Never Smoker  . Smokeless tobacco: Never Used  Substance and Sexual Activity  . Alcohol use: Not Currently    Comment: rarely  . Drug use: No  . Sexual activity: Never  Lifestyle  . Physical activity:    Days per week: Not on file    Minutes per session: Not on file  . Stress: Not on file  Relationships  . Social connections:    Talks on phone: Not on file    Gets together: Not on file    Attends religious service: Not on file    Active member of club or organization: Not on file    Attends meetings of clubs or organizations: Not on file    Relationship status: Not on file  Other Topics Concern  . Not on file  Social History Narrative  . Not on file     Family  History: The patient's family history includes Dementia in her mother; Osteoporosis in her mother; Other in her father; Prostate cancer in her maternal grandfather.  ROS:   Please see the history of present illness.    All other systems reviewed and are negative.  EKGs/Labs/Other Studies Reviewed:    The following studies were reviewed today: EKG reveals sinus rhythm and nonspecific ST-T changes.  Next   Recent Labs: No results found for requested labs within last 8760 hours.  Recent Lipid Panel No results found for: CHOL, TRIG, HDL, CHOLHDL, VLDL, LDLCALC, LDLDIRECT  Physical Exam:    VS:  BP 130/78   Pulse 75   Ht 5' 8.5" (1.74 m)   Wt 167 lb 6.4 oz (75.9 kg)   SpO2 98%   BMI 25.08 kg/m     Wt Readings from Last 3 Encounters:  08/21/17 167 lb 6.4 oz (75.9 kg)  01/25/16 161 lb (73 kg)  01/18/16 161 lb 9.6 oz (73.3 kg)     GEN: Patient is in no acute distress HEENT: Normal NECK: No JVD; No carotid bruits LYMPHATICS: No lymphadenopathy CARDIAC: Hear sounds regular, 2/6 systolic murmur at the apex. RESPIRATORY:  Clear to auscultation without rales, wheezing or rhonchi  ABDOMEN: Soft, non-tender, non-distended MUSCULOSKELETAL:  No edema; No deformity  SKIN: Warm and dry NEUROLOGIC:  Alert and oriented x 3 PSYCHIATRIC:  Normal affect   Signed, Garwin Brothers, MD  08/21/2017 1:47 PM    [facility]

## 2017-08-22 ENCOUNTER — Telehealth: Payer: Self-pay

## 2017-08-22 LAB — BASIC METABOLIC PANEL
BUN / CREAT RATIO: 19 (ref 12–28)
BUN: 15 mg/dL (ref 8–27)
CO2: 24 mmol/L (ref 20–29)
CREATININE: 0.77 mg/dL (ref 0.57–1.00)
Calcium: 9.5 mg/dL (ref 8.7–10.3)
Chloride: 99 mmol/L (ref 96–106)
GFR calc Af Amer: 86 mL/min/{1.73_m2} (ref >59)
GFR, EST NON AFRICAN AMERICAN: 74 mL/min/{1.73_m2} (ref >59)
GLUCOSE: 90 mg/dL (ref 65–99)
Potassium: 4.8 mmol/L (ref 3.5–5.2)
Sodium: 137 mmol/L (ref 134–144)

## 2017-08-22 LAB — CBC
HEMATOCRIT: 36.7 % (ref 34.0–46.6)
Hemoglobin: 11.9 g/dL (ref 11.1–15.9)
MCH: 29.8 pg (ref 26.6–33.0)
MCHC: 32.4 g/dL (ref 31.5–35.7)
MCV: 92 fL (ref 79–97)
Platelets: 399 10*3/uL (ref 150–450)
RBC: 3.99 x10E6/uL (ref 3.77–5.28)
RDW: 15.3 % (ref 12.3–15.4)
WBC: 8.9 10*3/uL (ref 3.4–10.8)

## 2017-08-22 LAB — TSH: TSH: 1.42 u[IU]/mL (ref 0.450–4.500)

## 2017-08-22 NOTE — Telephone Encounter (Signed)
-----   Message from Craige CottaAshley S Anderson, RN sent at 08/22/2017  9:46 AM EDT ----- Please call this patient.

## 2017-08-22 NOTE — Telephone Encounter (Signed)
Called patient and notified her of her lab results.  

## 2017-08-25 ENCOUNTER — Ambulatory Visit (INDEPENDENT_AMBULATORY_CARE_PROVIDER_SITE_OTHER): Payer: Medicare Other

## 2017-08-25 DIAGNOSIS — R55 Syncope and collapse: Secondary | ICD-10-CM | POA: Diagnosis not present

## 2017-10-10 ENCOUNTER — Other Ambulatory Visit: Payer: Self-pay

## 2017-10-10 MED ORDER — METOPROLOL SUCCINATE ER 25 MG PO TB24: 25.0000 mg | ORAL_TABLET | Freq: Every day | ORAL | 2 refills | Status: DC

## 2017-11-10 ENCOUNTER — Encounter: Payer: Self-pay | Admitting: Cardiology

## 2017-11-10 ENCOUNTER — Ambulatory Visit (INDEPENDENT_AMBULATORY_CARE_PROVIDER_SITE_OTHER): Payer: Medicare Other | Admitting: Cardiology

## 2017-11-10 VITALS — BP 130/84 | HR 72 | Ht 68.5 in | Wt 167.0 lb

## 2017-11-10 DIAGNOSIS — R55 Syncope and collapse: Secondary | ICD-10-CM

## 2017-11-10 DIAGNOSIS — E782 Mixed hyperlipidemia: Secondary | ICD-10-CM

## 2017-11-10 NOTE — Progress Notes (Signed)
Cardiology Office Note:    Date:  11/10/2017   ID:  [name], DOB [birth year], MRN [id number]  PCP:  Hurshel Party, NP  Cardiologist:  Garwin Brothers, MD   Referring MD: Hurshel Party, NP    ASSESSMENT:    1. Mixed hyperlipidemia   2. Syncope, unspecified syncope type    PLAN:    In order of problems listed above:  1. Primary prevention stressed with the patient.  Importance of compliance with diet and medications stressed and she vocalized understanding.  I told her to keep her salt and sodium uptake in the diet appropriate so that her blood pressure does not drop lower than what we would want to be.  She is agreeable.  Her blood pressure is stable.  She and her son had multiple questions which were answered to their satisfaction 2. Patient will be seen in follow-up appointment in 9 months or earlier if the patient has any concerns    Medication Adjustments/Labs and Tests Ordered: Current medicines are reviewed at length with the patient today.  Concerns regarding medicines are outlined above.  No orders of the defined types were placed in this encounter.  No orders of the defined types were placed in this encounter.    Chief Complaint  Patient presents with  . Follow-up     History of Present Illness:    Jessica [name] is a 77 y.o. female.  Patient was evaluated by me several months ago with a syncopal episode.  Her exam and evaluation including Holter monitoring and event monitoring was unremarkable.  She has had multiple symptoms of lightheadedness and dizziness on the monitor but this did not correlate with any significant findings.  She had PACs and therefore has been initiated on a low-dose beta-blocker which she has tolerated well.  Since last evaluation she has had no issues with syncope.  No chest pain orthopnea or PND.  Her son accompanies her for this visit.  Past Medical History:  Diagnosis Date  . Arthritis    oa  . Dizziness    related seeing vestibular  md for inner ear  . Hypothyroidism     Past Surgical History:  Procedure Laterality Date  . ANTERIOR CERVICAL DECOMP/DISCECTOMY FUSION  12/13/2011   Procedure: ANTERIOR CERVICAL DECOMPRESSION/DISCECTOMY FUSION 3 LEVELS;  Surgeon: Temple Pacini, MD;  Location: [facility];  Service: Neurosurgery;  Laterality: N/A;  Cervical Five-Six Cervical Six-Seven Cervical Seven-Thoracic One Anterior cervical decompression/diskectomy/fusion/plating/allograft  . BREAST BIOPSY Right 2000  . CARPAL TUNNEL RELEASE     right  . EYE SURGERY     cataract bilateral  . FOOT ARTHRODESIS Left 06/25/2015   Procedure: HALLUX INTERPHALANGEAL JOINT  ARTHRODESIS;  Surgeon: Toni Arthurs, MD;  Location: [facility];  Service: Orthopedics;  Laterality: Left;  Marland Kitchen GASTROCNEMIUS RECESSION Left 06/25/2015   Procedure: LEFT GASTROC RECESSION ;  Surgeon: Toni Arthurs, MD;  Location: [facility];  Service: Orthopedics;  Laterality: Left;  . HIP FRACTURE SURGERY Left   . TENDON REPAIR Left 06/25/2015   Procedure: LEFT ANTERIOR TIBIAL TENDON RECONSTRUCTION;  EXTENSOR HALLUCIS LONGUS TRANSFER ;  Surgeon: Toni Arthurs, MD;  Location: [facility];  Service: Orthopedics;  Laterality: Left;  . TOTAL KNEE ARTHROPLASTY Right 01/25/2016   Procedure: RIGHT TOTAL KNEE ARTHROPLASTY;  Surgeon: Durene Romans, MD;  Location: [facility];  Service: Orthopedics;  Laterality: Right;  . TUBAL LIGATION      Current Medications: Current Meds  Medication Sig  . aspirin EC 81 MG tablet Take 1 tablet (81 mg total) by mouth daily.  . Calcium Carb-Cholecalciferol (CALCIUM 500+D PO) Take 1 tablet by mouth daily.  . citalopram (CELEXA) 10 MG tablet Take 10 mg by mouth at bedtime.  . ergocalciferol (VITAMIN D2) 50000 UNITS capsule Take 50,000 Units by mouth once a week. Due on 10/15   . levothyroxine (SYNTHROID, LEVOTHROID) 125 MCG tablet Take 125 mcg by mouth daily. In the middle of the night  . metoprolol succinate  (TOPROL-XL) 25 MG 24 hr tablet Take 1 tablet (25 mg total) by mouth daily.  . Multiple Vitamin (MULTIVITAMIN WITH MINERALS) TABS tablet Take 7 tablets by mouth daily. Shacklee Multivitamin regimen  . omega-3 fish oil (MAXEPA) 1000 MG CAPS capsule Take 2 capsules by mouth daily.  . raloxifene (EVISTA) 60 MG tablet Take 60 mg by mouth daily. In the middle of the night     Allergies:   Patient has no known allergies.   Social History   Socioeconomic History  . Marital status: Widowed    Spouse name: Not on file  . Number of children: Not on file  . Years of education: Not on file  . Highest education level: Not on file  Occupational History  . Not on file  Social Needs  . Financial resource strain: Not on file  . Food insecurity:    Worry: Not on file    Inability: Not on file  . Transportation needs:    Medical: Not on file    Non-medical: Not on file  Tobacco Use  . Smoking status: Never Smoker  . Smokeless tobacco: Never Used  Substance and Sexual Activity  . Alcohol use: Not Currently    Comment: rarely  . Drug use: No  . Sexual activity: Never  Lifestyle  . Physical activity:    Days per week: Not on file    Minutes per session: Not on file  . Stress: Not on file  Relationships  . Social connections:    Talks on phone: Not on file    Gets together: Not on file    Attends religious service: Not on file    Active member of club or organization: Not on file    Attends meetings of clubs or organizations: Not on file    Relationship status: Not on file  Other Topics Concern  . Not on file  Social History Narrative  . Not on file     Family History: The patient's family history includes Dementia in her mother; Osteoporosis in her mother; Other in her father; Prostate cancer in her maternal grandfather.  ROS:   Please see the history of present illness.    All other systems reviewed and are negative.  EKGs/Labs/Other Studies Reviewed:    The following studies  were reviewed today: I discussed the findings with the patient at extensive length.   Recent Labs: 08/21/2017: BUN 15; Creatinine, Ser 0.77; Hemoglobin 11.9; Platelets 399; Potassium 4.8; Sodium 137; TSH 1.420  Recent Lipid Panel No results found for: CHOL, TRIG, HDL, CHOLHDL, VLDL, LDLCALC, LDLDIRECT  Physical Exam:    VS:  BP 130/84 (BP Location: Left Arm, Patient Position: Sitting, Cuff Size: Normal)   Pulse 72   Ht 5' 8.5" (1.74 m)   Wt 167 lb (75.8 kg)   SpO2 98%   BMI 25.02 kg/m     Wt Readings from Last 3 Encounters:  11/10/17 167 lb (75.8 kg)  08/21/17 167 lb 6.4  oz (75.9 kg)  01/25/16 161 lb (73 kg)     GEN: Patient is in no acute distress HEENT: Normal NECK: No JVD; No carotid bruits LYMPHATICS: No lymphadenopathy CARDIAC: Hear sounds regular, 2/6 systolic murmur at the apex. RESPIRATORY:  Clear to auscultation without rales, wheezing or rhonchi  ABDOMEN: Soft, non-tender, non-distended MUSCULOSKELETAL:  No edema; No deformity  SKIN: Warm and dry NEUROLOGIC:  Alert and oriented x 3 PSYCHIATRIC:  Normal affect   Signed, Garwin Brothers, MD  11/10/2017 9:31 AM    [facility]

## 2017-11-10 NOTE — Patient Instructions (Signed)
Medication Instructions:  Your physician recommends that you continue on your current medications as directed. Please refer to the Current Medication list given to you today.  Labwork: None  Testing/Procedures: None  Follow-Up: Your physician recommends that you schedule a follow-up appointment in: 9 months  Any Other Special Instructions Will Be Listed Below (If Applicable).     If you need a refill on your cardiac medications before your next appointment, please call your pharmacy.   [facility]  Ashley A, RN, BSN  

## 2017-11-13 ENCOUNTER — Other Ambulatory Visit: Payer: Self-pay | Admitting: Internal Medicine

## 2017-11-13 DIAGNOSIS — Z1231 Encounter for screening mammogram for malignant neoplasm of breast: Secondary | ICD-10-CM

## 2018-01-24 ENCOUNTER — Ambulatory Visit
Admission: RE | Admit: 2018-01-24 | Discharge: 2018-01-24 | Disposition: A | Discharge status: Home / Self Care | Payer: Medicare Other | Source: Ambulatory Visit | Attending: Internal Medicine | Admitting: Internal Medicine

## 2018-01-24 ENCOUNTER — Ambulatory Visit: Payer: Medicare Other

## 2018-01-24 DIAGNOSIS — Z1231 Encounter for screening mammogram for malignant neoplasm of breast: Secondary | ICD-10-CM

## 2018-06-25 ENCOUNTER — Other Ambulatory Visit: Payer: Self-pay | Admitting: Cardiology

## 2018-06-25 MED ORDER — METOPROLOL SUCCINATE ER 25 MG PO TB24: 25.0000 mg | ORAL_TABLET | Freq: Every day | ORAL | 1 refills | Status: DC

## 2018-06-25 NOTE — Telephone Encounter (Signed)
°*  STAT* If patient is at the pharmacy, call can be transferred to refill team.   1. Which medications need to be refilled? (please list name of each medication and dose if known) metoprolol succinate (TOPROL-XL) 25 MG 24 hr tablet  2. Which pharmacy/location (including street and city if local pharmacy) is medication to be sent to?  [facility] [phone] - [place], [place] - [address] AT [facility] [address] [phone] (Phone) [phone] (Fax)    3. Do they need a 30 day or 90 day supply? 90 day

## 2018-06-25 NOTE — Telephone Encounter (Signed)
Toprol XL refill being sent to [facility] in [place] per pt preference

## 2018-11-23 ENCOUNTER — Encounter: Payer: Self-pay | Admitting: Cardiology

## 2018-11-23 ENCOUNTER — Ambulatory Visit: Payer: Medicare Other | Admitting: Cardiology

## 2018-11-23 ENCOUNTER — Other Ambulatory Visit: Payer: Self-pay

## 2018-11-23 VITALS — BP 150/90 | HR 81 | Ht 68.5 in | Wt 173.0 lb

## 2018-11-23 DIAGNOSIS — I10 Essential (primary) hypertension: Secondary | ICD-10-CM

## 2018-11-23 DIAGNOSIS — E782 Mixed hyperlipidemia: Secondary | ICD-10-CM | POA: Diagnosis not present

## 2018-11-23 HISTORY — DX: Essential (primary) hypertension: I10

## 2018-11-23 NOTE — Progress Notes (Signed)
Cardiology Office Note:    Date:  11/23/2018   ID:  AMERIS AKAMINE, DOB Apr 01, 1940, MRN [id number]  PCP:  Hurshel Party, NP  Cardiologist:  Garwin Brothers, MD   Referring MD: Hurshel Party, NP    ASSESSMENT:    1. Mixed hyperlipidemia   2. Essential hypertension    PLAN:    In order of problems listed above:  1. Primary prevention stressed with the patient.  Importance of compliance with diet and medication stressed and she vocalized understanding. 2. Essential hypertension: She is brought multiple blood pressure readings and they are fine.  Her blood pressure is elevated at times when she is in pain or any such issues.  She has an element of whitecoat hypertension also. 3. Mixed dyslipidemia: Lipids are followed by primary care physician.Patient will be seen in follow-up appointment in 6 months or earlier if the patient has any concerns    Medication Adjustments/Labs and Tests Ordered: Current medicines are reviewed at length with the patient today.  Concerns regarding medicines are outlined above.  No orders of the defined types were placed in this encounter.  No orders of the defined types were placed in this encounter.    Chief Complaint  Patient presents with  . Follow-up     History of Present Illness:    Jessica [name] is a 78 y.o. female.  Patient has past medical history of essential hypertension and dyslipidemia.  She denies any problems at this time and takes care of activities of daily living.  No chest pain orthopnea or PND.  She ambulates with a cane.  At the time of my evaluation, the patient is alert awake oriented and in no distress.  Past Medical History:  Diagnosis Date  . Arthritis    oa  . Dizziness    related seeing vestibular md for inner ear  . Hypothyroidism     Past Surgical History:  Procedure Laterality Date  . ANTERIOR CERVICAL DECOMP/DISCECTOMY FUSION  12/13/2011   Procedure: ANTERIOR CERVICAL DECOMPRESSION/DISCECTOMY FUSION 3 LEVELS;   Surgeon: Temple Pacini, MD;  Location: [facility];  Service: Neurosurgery;  Laterality: N/A;  Cervical Five-Six Cervical Six-Seven Cervical Seven-Thoracic One Anterior cervical decompression/diskectomy/fusion/plating/allograft  . BREAST BIOPSY Right 2000  . CARPAL TUNNEL RELEASE     right  . EYE SURGERY     cataract bilateral  . FOOT ARTHRODESIS Left 06/25/2015   Procedure: HALLUX INTERPHALANGEAL JOINT  ARTHRODESIS;  Surgeon: Toni Arthurs, MD;  Location: [facility];  Service: Orthopedics;  Laterality: Left;  Marland Kitchen GASTROCNEMIUS RECESSION Left 06/25/2015   Procedure: LEFT GASTROC RECESSION ;  Surgeon: Toni Arthurs, MD;  Location: [facility];  Service: Orthopedics;  Laterality: Left;  . HIP FRACTURE SURGERY Left   . TENDON REPAIR Left 06/25/2015   Procedure: LEFT ANTERIOR TIBIAL TENDON RECONSTRUCTION;  EXTENSOR HALLUCIS LONGUS TRANSFER ;  Surgeon: Toni Arthurs, MD;  Location: [facility];  Service: Orthopedics;  Laterality: Left;  . TOTAL KNEE ARTHROPLASTY Right 01/25/2016   Procedure: RIGHT TOTAL KNEE ARTHROPLASTY;  Surgeon: Durene Romans, MD;  Location: [facility];  Service: Orthopedics;  Laterality: Right;  . TUBAL LIGATION      Current Medications: Current Meds  Medication Sig  . amoxicillin (AMOXIL) 500 MG capsule TK 4 CS PO 1 HOUR B DAPP  . aspirin EC 81 MG tablet Take 1 tablet (81 mg total) by mouth daily.  . Calcium Carb-Cholecalciferol (CALCIUM 500+D PO) Take 1 tablet by  mouth daily.  . citalopram (CELEXA) 10 MG tablet Take 10 mg by mouth at bedtime.  . ergocalciferol (VITAMIN D2) 50000 UNITS capsule Take 50,000 Units by mouth once a week. Due on 10/15   . FORTEO 600 MCG/2.4ML SOPN ADM 0.08 ML [place] D  . levothyroxine (SYNTHROID) 100 MCG tablet TK 1 T PO D  . metoprolol succinate (TOPROL-XL) 25 MG 24 hr tablet Take 1 tablet (25 mg total) by mouth daily.  . Multiple Vitamin (MULTIVITAMIN WITH MINERALS) TABS tablet Take 7 tablets by mouth daily. [place]  Multivitamin regimen  . omega-3 fish oil (MAXEPA) 1000 MG CAPS capsule Take 2 capsules by mouth daily.  . rosuvastatin (CRESTOR) 5 MG tablet TK 1 T PO 2 TIMES A WK     Allergies:   Patient has no known allergies.   Social History   Socioeconomic History  . Marital status: Widowed    Spouse name: Not on file  . Number of children: Not on file  . Years of education: Not on file  . Highest education level: Not on file  Occupational History  . Not on file  Social Needs  . Financial resource strain: Not on file  . Food insecurity    Worry: Not on file    Inability: Not on file  . Transportation needs    Medical: Not on file    Non-medical: Not on file  Tobacco Use  . Smoking status: Never Smoker  . Smokeless tobacco: Never Used  Substance and Sexual Activity  . Alcohol use: Not Currently    Comment: rarely  . Drug use: No  . Sexual activity: Never  Lifestyle  . Physical activity    Days per week: Not on file    Minutes per session: Not on file  . Stress: Not on file  Relationships  . Social Herbalist on phone: Not on file    Gets together: Not on file    Attends religious service: Not on file    Active member of club or organization: Not on file    Attends meetings of clubs or organizations: Not on file    Relationship status: Not on file  Other Topics Concern  . Not on file  Social History Narrative  . Not on file     Family History: The patient's family history includes Dementia in her mother; Osteoporosis in her mother; Other in her father; Prostate cancer in her maternal grandfather.  ROS:   Please see the history of present illness.    All other systems reviewed and are negative.  EKGs/Labs/Other Studies Reviewed:    The following studies were reviewed today: EVENT MONITOR REPORT:   Patient was monitored from 08/25/2017 to 09/26/2017. Indication:                    Syncope and collapse Ordering physician:  Jenean Lindau, MD  Referring  physician:        Jenean Lindau, MD    Baseline rhythm: Sinus and the baseline tracing revealing heart rate of 70  Minimum heart rate: 54 BPM. Maximal heart rate 141 BPM.  Atrial arrhythmia: Rare PACs  Ventricular arrhythmia: Rare PVCs  Conduction abnormality: None significant  Symptoms: Patient complained of dizziness and lightheadedness at times   Conclusion:  The monitoring was overall unremarkable.  The patient had multiple complaints of dizziness and lightheadedness.  At times the correlated with PVCs on the monitor and at times there were no significant.  Interpreting  cardiologist: Garwin Brothersajan R Revankar, MD  Date: 10/10/2017 10:03 AM    Recent Labs: No results found for requested labs within last 8760 hours.  Recent Lipid Panel No results found for: CHOL, TRIG, HDL, CHOLHDL, VLDL, LDLCALC, LDLDIRECT  Physical Exam:    VS:  BP (!) 150/90 (BP Location: Left Arm, Patient Position: Sitting, Cuff Size: Normal)   Pulse 81   Ht 5' 8.5" (1.74 m)   Wt 173 lb (78.5 kg)   SpO2 98%   BMI 25.92 kg/m     Wt Readings from Last 3 Encounters:  11/23/18 173 lb (78.5 kg)  11/10/17 167 lb (75.8 kg)  08/21/17 167 lb 6.4 oz (75.9 kg)     GEN: Patient is in no acute distress HEENT: Normal NECK: No JVD; No carotid bruits LYMPHATICS: No lymphadenopathy CARDIAC: Hear sounds regular, 2/6 systolic murmur at the apex. RESPIRATORY:  Clear to auscultation without rales, wheezing or rhonchi  ABDOMEN: Soft, non-tender, non-distended MUSCULOSKELETAL:  No edema; No deformity  SKIN: Warm and dry NEUROLOGIC:  Alert and oriented x 3 PSYCHIATRIC:  Normal affect   Signed, Garwin Brothersajan R Revankar, MD  11/23/2018 11:41 AM    [facility]

## 2018-11-23 NOTE — Patient Instructions (Signed)

## 2018-12-21 ENCOUNTER — Other Ambulatory Visit: Payer: Self-pay | Admitting: Internal Medicine

## 2018-12-21 ENCOUNTER — Other Ambulatory Visit: Payer: Self-pay | Admitting: Cardiology

## 2018-12-21 DIAGNOSIS — Z1231 Encounter for screening mammogram for malignant neoplasm of breast: Secondary | ICD-10-CM

## 2018-12-28 ENCOUNTER — Encounter: Payer: Self-pay | Admitting: Sports Medicine

## 2018-12-28 ENCOUNTER — Other Ambulatory Visit: Payer: Self-pay

## 2018-12-28 ENCOUNTER — Ambulatory Visit: Payer: Medicare Other | Admitting: Sports Medicine

## 2018-12-28 DIAGNOSIS — M79675 Pain in left toe(s): Secondary | ICD-10-CM

## 2018-12-28 DIAGNOSIS — E1121 Type 2 diabetes mellitus with diabetic nephropathy: Secondary | ICD-10-CM

## 2018-12-28 DIAGNOSIS — B351 Tinea unguium: Secondary | ICD-10-CM | POA: Diagnosis not present

## 2018-12-28 DIAGNOSIS — R200 Anesthesia of skin: Secondary | ICD-10-CM

## 2018-12-28 DIAGNOSIS — M79674 Pain in right toe(s): Secondary | ICD-10-CM

## 2018-12-28 NOTE — Progress Notes (Signed)
Subjective: JAQUELINNE GLENDENING is a 78 y.o. female patient with history of PRE-diabetes who presents to office today complaining of long,mildly painful nails  while ambulating in shoes; unable to trim. Patient states that the glucose reading this morning was 103 mg/dl.  Last A1c 6.1 and last visit to PCP was in August.  Patient denies any new changes in medication or new problems. Patient admits to a history of numbness on both feet since 2017 reports that the left side has been numb ever since she broke her tendon and had surgery on the left foot patient also admits to a history of 2 falls and the most recent fall she broke her femur and is currently in physical therapy for hip pain.  Patient reports that she has some stiffness over the tops of both feet specifically on the left and has been treating this area using Biofreeze.  Patient reports that it is difficult to trim her nails because she cannot reach them.  Patient denies any other pedal complaints at this time.  Review of Systems  All other systems reviewed and are negative.   Patient Active Problem List   Diagnosis Date Noted  . Essential hypertension 11/23/2018  . Hypothyroidism 08/21/2017  . Hyperlipidemia 08/21/2017  . Controlled type 2 diabetes mellitus (HCC) 08/21/2017  . Syncope 08/21/2017  . 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 Cervical spinal stenosis 12/13/2011   Current Outpatient Medications on File Prior to Visit  Medication Sig Dispense Refill  . Calcium Carb-Cholecalciferol (CALCIUM 500+D PO) Take 1 tablet by mouth daily.    . citalopram (CELEXA) 10 MG tablet Take 10 mg by mouth at bedtime.    . ergocalciferol (VITAMIN D2) 50000 UNITS capsule Take 50,000 Units by mouth once a week. Due on 10/15     . FORTEO 600 MCG/2.4ML SOPN ADM 0.08 ML [place] D    . levothyroxine (SYNTHROID) 100 MCG tablet TK 1 T PO D    . metoprolol succinate (TOPROL-XL) 25 MG 24 hr tablet TAKE 1 TABLET(25 MG) BY MOUTH DAILY 90 tablet 1  . Multiple Vitamin  (MULTIVITAMIN WITH MINERALS) TABS tablet Take 7 tablets by mouth daily. [place] Multivitamin regimen    . rosuvastatin (CRESTOR) 5 MG tablet TK 1 T PO 2 TIMES A WK     No current facility-administered medications on file prior to visit.    No Known Allergies  No results found for this or any previous visit (from the past 2160 hour(s)).  Objective: General: Patient is awake, alert, and oriented x 3 and in no acute distress.  Integument: Skin is warm, dry and supple bilateral. Nails are tender, long, thickened and dystrophic with subungual debris, consistent with onychomycosis, 1-5 bilateral.  All surgical scars well-healed.  No signs of infection. No open lesions or preulcerative lesions present bilateral. Remaining integument unremarkable.  Vasculature:  Dorsalis Pedis pulse 1/4 bilateral. Posterior Tibial pulse  1/4 bilateral. Capillary fill time <5 sec 1-5 bilateral.  Scant hair growth to the level of the digits.  Moderate varicosities bilateral.  Temperature gradient within normal limits. No edema present bilateral.   Neurology: Negative Tinel signs bilateral.  Subjective numbness bilateral.  Musculoskeletal: Asymptomatic pedal deformities noted bilateral.  There is palpable dorsal bone spur midfoot right and left with the right when bigger.  There is also weakness of the first toe due to previous injury to the extensor hallucis longus tendon strength 4 out of 5 on left 5 out of 5 on right.  There is significant  lesser hammertoe deformity.  No pain to palpation of calf bilateral.  Assessment and Plan: Problem List Items Addressed This Visit      Endocrine   Controlled type 2 diabetes mellitus ([place])    Other Visit Diagnoses    Pain due to onychomycosis of toenails of both feet    -  Primary   Numbness         -Examined patient. -Discussed and educated patient on diabetic foot care, especially with  regards to the vascular, neurological and musculoskeletal systems.  -Stressed the  importance of good glycemic control and the detriment of not controlling glucose levels in relation to the foot. -Mechanically debrided all nails 1-5 bilateral using sterile nail nipper and filed with dremel without incident  -Advised topical pain cream as needed for numbness -Answered all patient questions -Patient to return  in 3 months for at risk foot care -Patient advised to call the office if any problems or questions arise in the meantime.  Landis Martins, DPM

## 2019-02-11 ENCOUNTER — Ambulatory Visit: Payer: Medicare Other

## 2019-04-03 ENCOUNTER — Ambulatory Visit
Admission: RE | Admit: 2019-04-03 | Discharge: 2019-04-03 | Disposition: A | Discharge status: Home / Self Care | Payer: Medicare Other | Source: Ambulatory Visit | Attending: Internal Medicine | Admitting: Internal Medicine

## 2019-04-03 ENCOUNTER — Other Ambulatory Visit: Payer: Self-pay

## 2019-04-03 DIAGNOSIS — Z1231 Encounter for screening mammogram for malignant neoplasm of breast: Secondary | ICD-10-CM

## 2019-04-08 ENCOUNTER — Other Ambulatory Visit: Payer: Self-pay | Admitting: Internal Medicine

## 2019-04-08 DIAGNOSIS — R928 Other abnormal and inconclusive findings on diagnostic imaging of breast: Secondary | ICD-10-CM

## 2019-04-17 DIAGNOSIS — H02834 Dermatochalasis of left upper eyelid: Secondary | ICD-10-CM | POA: Diagnosis not present

## 2019-04-17 DIAGNOSIS — Z961 Presence of intraocular lens: Secondary | ICD-10-CM | POA: Diagnosis not present

## 2019-04-17 DIAGNOSIS — H02831 Dermatochalasis of right upper eyelid: Secondary | ICD-10-CM | POA: Diagnosis not present

## 2019-04-17 DIAGNOSIS — H04123 Dry eye syndrome of bilateral lacrimal glands: Secondary | ICD-10-CM | POA: Diagnosis not present

## 2019-04-18 ENCOUNTER — Other Ambulatory Visit: Payer: Self-pay | Admitting: Internal Medicine

## 2019-04-18 ENCOUNTER — Ambulatory Visit
Admission: RE | Admit: 2019-04-18 | Discharge: 2019-04-18 | Disposition: A | Discharge status: Home / Self Care | Payer: Medicare PPO | Source: Ambulatory Visit | Attending: Internal Medicine | Admitting: Internal Medicine

## 2019-04-18 ENCOUNTER — Other Ambulatory Visit: Payer: Self-pay

## 2019-04-18 DIAGNOSIS — R928 Other abnormal and inconclusive findings on diagnostic imaging of breast: Secondary | ICD-10-CM

## 2019-04-18 DIAGNOSIS — R921 Mammographic calcification found on diagnostic imaging of breast: Secondary | ICD-10-CM | POA: Diagnosis not present

## 2019-04-19 DIAGNOSIS — R42 Dizziness and giddiness: Secondary | ICD-10-CM | POA: Diagnosis not present

## 2019-04-19 DIAGNOSIS — Z6827 Body mass index (BMI) 27.0-27.9, adult: Secondary | ICD-10-CM | POA: Diagnosis not present

## 2019-04-25 ENCOUNTER — Encounter: Payer: Self-pay | Admitting: Neurology

## 2019-04-25 ENCOUNTER — Ambulatory Visit: Payer: Medicare PPO | Admitting: Neurology

## 2019-04-25 ENCOUNTER — Other Ambulatory Visit: Payer: Self-pay

## 2019-04-25 VITALS — BP 147/85 | HR 79 | Temp 97.7°F | Ht 68.0 in | Wt 178.0 lb

## 2019-04-25 DIAGNOSIS — E1121 Type 2 diabetes mellitus with diabetic nephropathy: Secondary | ICD-10-CM | POA: Diagnosis not present

## 2019-04-25 DIAGNOSIS — I10 Essential (primary) hypertension: Secondary | ICD-10-CM

## 2019-04-25 DIAGNOSIS — R296 Repeated falls: Secondary | ICD-10-CM | POA: Diagnosis not present

## 2019-04-25 DIAGNOSIS — H905 Unspecified sensorineural hearing loss: Secondary | ICD-10-CM | POA: Diagnosis not present

## 2019-04-25 DIAGNOSIS — I951 Orthostatic hypotension: Secondary | ICD-10-CM

## 2019-04-25 DIAGNOSIS — G903 Multi-system degeneration of the autonomic nervous system: Secondary | ICD-10-CM | POA: Diagnosis not present

## 2019-04-25 DIAGNOSIS — R251 Tremor, unspecified: Secondary | ICD-10-CM | POA: Diagnosis not present

## 2019-04-25 DIAGNOSIS — H81319 Aural vertigo, unspecified ear: Secondary | ICD-10-CM

## 2019-04-25 DIAGNOSIS — G44309 Post-traumatic headache, unspecified, not intractable: Secondary | ICD-10-CM | POA: Diagnosis not present

## 2019-04-25 HISTORY — DX: Repeated falls: R29.6

## 2019-04-25 HISTORY — DX: Aural vertigo, unspecified ear: H81.319

## 2019-04-25 HISTORY — DX: Orthostatic hypotension: I95.1

## 2019-04-25 HISTORY — DX: Unspecified sensorineural hearing loss: H90.5

## 2019-04-25 HISTORY — DX: Multi-system degeneration of the autonomic nervous system: G90.3

## 2019-04-25 HISTORY — DX: Post-traumatic headache, unspecified, not intractable: G44.309

## 2019-04-25 HISTORY — DX: Tremor, unspecified: R25.1

## 2019-04-25 NOTE — Progress Notes (Signed)
SLEEP MEDICINE CLINIC    Provider:  Melvyn Novas, MD  Primary Care Physician:  Hurshel Party, NP [address] [place] [address]     Referring Provider: Hurshel Party, Np [address] [place],  [place] [address]          Chief Complaint according to patient   Patient presents with:    . New Patient (Initial Visit)     oct 2017 very first episode. it has been intermittent since then over the years. more recently it has started happening more frequently she states that the episodes are happening more frequently 3 episodes last week, 2 on tues and one today. she states when she is calling it vertigo it is when she stands up and can hardly remain standing because the room is spinning and it typically it spins to the right.       Other pt states that when she has these episodes it affects your balance for several days. she has not related it to being a positional she notices sometimes when she turns the left that it happens but she has had carotid dopplers completed          HISTORY OF PRESENT ILLNESS:  Jessica [name] is a 79 year old Caucasian female patient seen here upon  a referral on 04/25/2019 from cardiology and PCP.   Chief concern according to patient :   I have frequent Vertigo since 11-2015 and twice passed out, each time I broke something, the first spell in 05-22-2017, and the second one 07-25-2017 when I broke my hip . I wore a heart monitor. .    I have the pleasure of seeing Jessica [name] today, a right -handed White or Caucasian female with a possible sleep disorder.  She has a  has a past medical history of Arthritis, Dizziness/ Vertigo with spinning sensation ,  Syncope , and Hypothyroidism.Marland Kitchen  She was considered borderline DM and changed that with diet.  Today her spinning sensation occurred while on the commode to urinate.  She has been hydrating well, 3 liters a day.  She has orthostatic problems. She has a PT that come to the home, had dry needling  for neck ROM.  Had several orthopedic surgeries.     Social history:  Patient is retired from middle school and [employer] and lives in a household with her youngest step son-, is widowed. She has 2 stepsons. Pets are present, a dog.Tobacco use: never .  ETOH use moderately in the past, none for years , Caffeine intake in form of Coffee( 1 cup in AM ) Soda( rare ) Tea ( none) or energy drinks..   Hobbies : reading.  Sleep habits are as follows: The patient's dinner time is between 5.30- 6  PM. The patient goes to bed at 10 PM and continues to sleep for  hours, wakes for 1-2 bathroom breaks.   The preferred sleep position is right side, with the support of  pillows.  Dreams are reportedly frequent/vivid.  6.30  AM is the usual rise time. The patient wakes up spontaneously.  She reports not feeling refreshed or restored in AM.  Review of Systems: Out of a complete 14 system review, the patient complains of only the following symptoms, and all other reviewed systems are negative  Feeling in motion when not moving, spinning sensation when at worst case scenario, left foot drop,  Stiffness of gait,  Dizziness when walking, orthostatic BP changes. Lightheadedness.   left head turn is a trigger for vertigo.     Social History   Socioeconomic History  . Marital status: Widowed    Spouse name: Not on file  . Number of children: Not on file  . Years of education: Not on file  . Highest education level: Not on file  Occupational History  . Not on file  Tobacco Use  . Smoking status: Never Smoker  . Smokeless tobacco: Never Used  Substance and Sexual Activity  . Alcohol use: Not Currently    Comment: rarely  . Drug use: No  . Sexual activity: Never  Other Topics Concern  . Not on file  Social History Narrative  . Not on file   Social Determinants of Health   Financial Resource Strain:   . Difficulty of Paying Living Expenses: Not on file  Food Insecurity:   . Worried  About Programme researcher, broadcasting/film/video in the Last Year: Not on file  . Ran Out of Food in the Last Year: Not on file  Transportation Needs:   . Lack of Transportation (Medical): Not on file  . Lack of Transportation (Non-Medical): Not on file  Physical Activity:   . Days of Exercise per Week: Not on file  . Minutes of Exercise per Session: Not on file  Stress:   . Feeling of Stress : Not on file  Social Connections:   . Frequency of Communication with Friends and Family: Not on file  . Frequency of Social Gatherings with Friends and Family: Not on file  . Attends Religious Services: Not on file  . Active Member of Clubs or Organizations: Not on file  . Attends Banker Meetings: Not on file  . Marital Status: Not on file    Family History  Problem Relation Age of Onset  . Dementia Mother   . Osteoporosis Mother   . Other Father        Senescence  . Prostate cancer Maternal Grandfather     Past Medical History:  Diagnosis Date  . Arthritis    oa  . Dizziness    related seeing vestibular md for inner ear  . Hypothyroidism     Past Surgical History:  Procedure Laterality Date  . ANTERIOR CERVICAL DECOMP/DISCECTOMY FUSION  12/13/2011   Procedure: ANTERIOR CERVICAL DECOMPRESSION/DISCECTOMY FUSION 3 LEVELS;  Surgeon: Temple Pacini, MD;  Location: [facility];  Service: Neurosurgery;  Laterality: N/A;  Cervical Five-Six Cervical Six-Seven Cervical Seven-Thoracic One Anterior cervical decompression/diskectomy/fusion/plating/allograft  . BREAST BIOPSY Right 2000  . CARPAL TUNNEL RELEASE     right  . EYE SURGERY     cataract bilateral  . FOOT ARTHRODESIS Left 06/25/2015   Procedure: HALLUX INTERPHALANGEAL JOINT  ARTHRODESIS;  Surgeon: Toni Arthurs, MD;  Location: [facility];  Service: Orthopedics;  Laterality: Left;  Marland Kitchen GASTROCNEMIUS RECESSION Left 06/25/2015   Procedure: LEFT GASTROC RECESSION ;  Surgeon: Toni Arthurs, MD;  Location: [facility];   Service: Orthopedics;  Laterality: Left;  . HIP FRACTURE SURGERY Left   . TENDON REPAIR Left 06/25/2015   Procedure: LEFT ANTERIOR TIBIAL TENDON RECONSTRUCTION;  EXTENSOR HALLUCIS LONGUS TRANSFER ;  Surgeon: Toni Arthurs, MD;  Location:  SURGERY CENTER;  Service: Orthopedics;  Laterality: Left;  . TOTAL KNEE ARTHROPLASTY Right 01/25/2016   Procedure: RIGHT TOTAL KNEE ARTHROPLASTY;  Surgeon: Durene Romans, MD;  Location: [facility];  Service: Orthopedics;  Laterality: Right;  .  TUBAL LIGATION       Current Outpatient Medications on File Prior to Visit  Medication Sig Dispense Refill  . Calcium Carb-Cholecalciferol (CALCIUM 500+D PO) Take 1 tablet by mouth daily.    . citalopram (CELEXA) 10 MG tablet Take 10 mg by mouth at bedtime.    . ergocalciferol (VITAMIN D2) 50000 UNITS capsule Take 50,000 Units by mouth once a week. Due on 10/15     . FORTEO 600 MCG/2.4ML SOPN ADM 0.08 ML [place] D    . levothyroxine (SYNTHROID) 100 MCG tablet TK 1 T PO D    . metoprolol succinate (TOPROL-XL) 25 MG 24 hr tablet TAKE 1 TABLET(25 MG) BY MOUTH DAILY 90 tablet 1  . Multiple Vitamin (MULTIVITAMIN WITH MINERALS) TABS tablet Take 7 tablets by mouth daily. [place] Multivitamin regimen    . rosuvastatin (CRESTOR) 5 MG tablet TK 1 T PO 2 TIMES A WK     No current facility-administered medications on file prior to visit.    No Known Allergies  Physical exam:  Today's Vitals   04/25/19 1556 04/25/19 1557 04/25/19 1558  BP: (!) 175/84 (!) 162/83 (!) 147/85  Pulse: 71 74 79  Temp:  97.7 F (36.5 C)   Weight:  178 lb (80.7 kg)   Height:  5\' 8"  (1.727 m)    Body mass index is 27.06 kg/m.   Wt Readings from Last 3 Encounters:  04/25/19 178 lb (80.7 kg)  11/23/18 173 lb (78.5 kg)  11/10/17 167 lb (75.8 kg)     Ht Readings from Last 3 Encounters:  04/25/19 5\' 8"  (1.727 m)  11/23/18 5' 8.5" (1.74 m)  11/10/17 5' 8.5" (1.74 m)      General: The patient is awake, alert and appears not in acute  distress. The patient is well groomed. Head: Normocephalic, atraumatic. Neck is supple. Mallampati 3.,  neck circumference:14.5 inches . Nasal airflow patent.  Retrognathia is not  seen.  Dental status:  dentures Cardiovascular:  irregular rate and cardiac rhythm by pulse,  without distended neck veins. Respiratory: Lungs are clear to auscultation.  Skin:  Without evidence of ankle edema, or rash. Trunk: The patient's posture is erect, not stooped.    Neurologic exam : The patient is awake and alert, oriented to place and time.   Memory subjective described as intact.  Attention span & concentration ability appears normal.  Speech is fluent,  without  dysarthria, dysphonia or aphasia.  Mood and affect are appropriate.   Cranial nerves: no loss of smell or taste reported  Pupils are equal and briskly reactive to light. Funduscopic exam ; status post cataract surgery-  Extraocular movements in vertical and horizontal planes were intact and without nystagmus. No Diplopia. Visual fields by finger perimetry are intact. Hearing was impaired on the left- uses a hearing aid.  Facial sensation intact to fine touch.  Facial motor strength is symmetric and tongue and uvula move midline.  Slight jaw tremor, no tongue tremor.  Neck ROM : rotation, tilt and flexion extension were restricted  for age and shoulder shrug was lower on the left   none in the right  Motor exam:  increased tone in the left arm, biceps, cog-wheeling. Symmetric bulk, \ ROM.   Normal tone without cog-wheeling, symmetric grip strength . Sensory:  Fine touch, pinprick and vibration were  normal.  Proprioception tested in the upper extremities was normal. Coordination: Rapid alternating movements in the fingers/hands were of normal speed.  The Finger-to-nose maneuver was impaired -  evidence of ataxia, dysmetria or tremor. There is tremor in both hands.    Gait and station: Patient could rise unassisted from a seated position,  walked without assistive device.  Stance is of normal width/ base and the patient turned with 4 steps.  She walked very rigid, erect and showed a clear foot drop.  Toe and heel walk were deferred.  Deep tendon reflexes: in the  upper and lower extremities are symmetric and intact.  Babinski response was deferred.     Jessica [name] is a 79 year old Caucasian right-handed female and retired Runner, broadcasting/film/video who had been evaluated in the year 2019 for repeated syncopal episodes.  Her exam at that time was unremarkable and the evaluations were 30-day Holter monitor revealed no cardiac abnormalities.  Even at that time she had multiple symptoms of lightheadedness and dizziness.  She had premature atrial count tractions on her EKG and her cardiologist chose to put her on a beta-blocker to reduce these extrasystoles.  There was no chest pain.  In the meantime her gait has become more unsteady and she feels more insecure as she walks plays a certain level of hypervigilance.  We started our exam after a lengthy interview with orthostatic blood pressure measures and she shows a quite significant drop in her supine to standing blood pressure a total of 28 mmHg by her systolic blood pressure dropped her diastolic blood pressure remained stable and her heart rate varied not very greatly.  Between supine and seated position she felt dizzy and she felt as if in motion although she was not moving, for the second part changing from a seated to a standing position she felt lightheaded as well.  Her gait is significantly affected there is #1 her foot drop she walks like a person with a neuropathy that does not feel the ground she walks on there is an insecurity of where to place her feet and her tendency is to walk the ground to see where she steps, because she does not have a reliable feedback mechanism. Has the typical electric shock sensations arising from her feet and ankles with tops of my feet and the tops of her feet tingle.     Further noted were bilateral probably essential tremor with some cogwheeling over the biceps area usually a sign of Parkinson's disease but she had a history of restricted range of motion for neck and shoulder on the left and actually had a cervical spine surgery fusion.  Anterior access yes.  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  The vestibular organ is a possible cause because rapid changing of the head to the left can bring on a spinning sensation of vertigo.  She has this problem rarely.  Has the typical electric shock sensations arising from her feet and ankles with tops of my feet and the tops of her feet tingle.   #2 orthopedic reasons mainly foot drop, knee and hip replacement.   Etiology  #3 orthostatic dysautonomia-she very likely some vestibulitis.  The vestibular organ is a possible cause because rapid changing of the head to the left can bring on a spinning sensation of vertigo.  She has this problem rarely.    She broke her odontoid process C 2 in a fall and has a record of 3 concussions. 2 with the last falls.  After spending a total time of  65 minutes face to face with  time for physical and neurologic examination, review of laboratory studies,  personal review of imaging studies, reports and results of other testing and review of referral information / records as far as provided in visit, I have established the following assessments   My Plan is to proceed with:  1) no compression stockings indicated, I plan to have her evaluated by vestibular rehab.  2) MRI brain, with and without. roule out brainstem or cerebellar lesion  3) continue PT, hydration, but speak to cardiology about a change from betablocker to ACE inhibitor.   I would like to thank Manson Allan, Amy A, NP, for allowing me to meet with and to take care of  this pleasant patient.   I I plan to follow up either personally in 3 month   CC: I will share my notes with PCP   Electronically signed by: Larey Seat, MD 04/25/2019 4:02 PM  [facility] certified by The [employer] of Sleep Medicine and Diplomate of the [employer] of Sleep Medicine. Board certified In Neurology through the [place], Fellow of the [employer] of Neurology. Medical Director of [employer].

## 2019-04-26 LAB — COMPREHENSIVE METABOLIC PANEL
ALT: 29 IU/L (ref 0–32)
AST: 24 IU/L (ref 0–40)
Albumin/Globulin Ratio: 1.5 (ref 1.2–2.2)
Albumin: 4.7 g/dL (ref 3.7–4.7)
Alkaline Phosphatase: 65 IU/L (ref 39–117)
BUN/Creatinine Ratio: 22 (ref 12–28)
BUN: 20 mg/dL (ref 8–27)
Bilirubin Total: 0.3 mg/dL (ref 0.0–1.2)
CO2: 22 mmol/L (ref 20–29)
Calcium: 10.4 mg/dL — ABNORMAL HIGH (ref 8.7–10.3)
Chloride: 96 mmol/L (ref 96–106)
Creatinine, Ser: 0.89 mg/dL (ref 0.57–1.00)
GFR calc Af Amer: 71 mL/min/{1.73_m2} (ref >59)
GFR calc non Af Amer: 61 mL/min/{1.73_m2} (ref >59)
Globulin, Total: 3.1 g/dL (ref 1.5–4.5)
Glucose: 103 mg/dL — ABNORMAL HIGH (ref 65–99)
Potassium: 4.5 mmol/L (ref 3.5–5.2)
Sodium: 135 mmol/L (ref 134–144)
Total Protein: 7.8 g/dL (ref 6.0–8.5)

## 2019-04-26 NOTE — Progress Notes (Signed)
Excellent results for metabolic panel- the glucose level was non-fasting,  Calcium was high- just by a 0.1 mg margin- I am not concerned.    cc AMY MOON<,NP - not in my Epic system

## 2019-04-30 ENCOUNTER — Telehealth: Payer: Self-pay | Admitting: Neurology

## 2019-04-30 NOTE — Telephone Encounter (Signed)
Called the patient and there was no answer. LVM advising the patient that lab work was in normal range for the most part. Advised there was nothing concerning from Dr Dohmeier's standpoint. Advised the patient to call back with any questions she may have.

## 2019-04-30 NOTE — Telephone Encounter (Signed)
-----   Message from Melvyn Novas, MD sent at 04/26/2019 10:46 AM EST ----- Excellent results for metabolic panel- the glucose level was non-fasting,  Calcium was high- just by a 0.1 mg margin- I am not concerned.    cc AMY MOON<,NP - not in my Epic system

## 2019-05-02 ENCOUNTER — Ambulatory Visit
Admission: RE | Admit: 2019-05-02 | Discharge: 2019-05-02 | Disposition: A | Discharge status: Home / Self Care | Payer: Medicare PPO | Source: Ambulatory Visit | Attending: Internal Medicine | Admitting: Internal Medicine

## 2019-05-02 ENCOUNTER — Other Ambulatory Visit: Payer: Self-pay

## 2019-05-02 DIAGNOSIS — R921 Mammographic calcification found on diagnostic imaging of breast: Secondary | ICD-10-CM

## 2019-05-02 DIAGNOSIS — N6489 Other specified disorders of breast: Secondary | ICD-10-CM | POA: Diagnosis not present

## 2019-05-02 HISTORY — PX: BREAST BIOPSY: SHX20

## 2019-05-07 DIAGNOSIS — E785 Hyperlipidemia, unspecified: Secondary | ICD-10-CM | POA: Diagnosis not present

## 2019-05-07 DIAGNOSIS — M1990 Unspecified osteoarthritis, unspecified site: Secondary | ICD-10-CM | POA: Diagnosis not present

## 2019-05-07 DIAGNOSIS — E119 Type 2 diabetes mellitus without complications: Secondary | ICD-10-CM | POA: Diagnosis not present

## 2019-05-07 DIAGNOSIS — E039 Hypothyroidism, unspecified: Secondary | ICD-10-CM | POA: Diagnosis not present

## 2019-05-07 DIAGNOSIS — M810 Age-related osteoporosis without current pathological fracture: Secondary | ICD-10-CM | POA: Diagnosis not present

## 2019-05-07 DIAGNOSIS — H8113 Benign paroxysmal vertigo, bilateral: Secondary | ICD-10-CM | POA: Diagnosis not present

## 2019-05-07 DIAGNOSIS — Z6826 Body mass index (BMI) 26.0-26.9, adult: Secondary | ICD-10-CM | POA: Diagnosis not present

## 2019-05-07 DIAGNOSIS — I491 Atrial premature depolarization: Secondary | ICD-10-CM | POA: Diagnosis not present

## 2019-05-07 DIAGNOSIS — E559 Vitamin D deficiency, unspecified: Secondary | ICD-10-CM | POA: Diagnosis not present

## 2019-05-13 ENCOUNTER — Encounter: Payer: Self-pay | Admitting: Neurology

## 2019-05-20 DIAGNOSIS — Z9181 History of falling: Secondary | ICD-10-CM | POA: Diagnosis not present

## 2019-05-20 DIAGNOSIS — Z1331 Encounter for screening for depression: Secondary | ICD-10-CM | POA: Diagnosis not present

## 2019-05-20 DIAGNOSIS — E785 Hyperlipidemia, unspecified: Secondary | ICD-10-CM | POA: Diagnosis not present

## 2019-05-20 DIAGNOSIS — Z0000 Encounter for general adult medical examination without abnormal findings: Secondary | ICD-10-CM | POA: Diagnosis not present

## 2019-05-20 DIAGNOSIS — N959 Unspecified menopausal and perimenopausal disorder: Secondary | ICD-10-CM | POA: Diagnosis not present

## 2019-05-25 ENCOUNTER — Other Ambulatory Visit: Payer: Self-pay

## 2019-05-25 ENCOUNTER — Ambulatory Visit
Admission: RE | Admit: 2019-05-25 | Discharge: 2019-05-25 | Disposition: A | Discharge status: Home / Self Care | Payer: Medicare PPO | Source: Ambulatory Visit | Attending: Neurology | Admitting: Neurology

## 2019-05-25 DIAGNOSIS — G44309 Post-traumatic headache, unspecified, not intractable: Secondary | ICD-10-CM | POA: Diagnosis not present

## 2019-05-25 MED ORDER — GADOBENATE DIMEGLUMINE 529 MG/ML IV SOLN
17.0000 mL | Freq: Once | INTRAVENOUS | Status: AC | PRN
  Administered 2019-05-25: 15:00:00 13 mL via INTRAVENOUS

## 2019-05-27 ENCOUNTER — Encounter: Payer: Self-pay | Admitting: Neurology

## 2019-05-27 NOTE — Progress Notes (Signed)
Mrs Swett's brain MRI was read as normal for age.

## 2019-06-10 ENCOUNTER — Encounter: Payer: Self-pay | Admitting: Physical Therapy

## 2019-06-10 ENCOUNTER — Ambulatory Visit: Payer: Medicare PPO | Attending: Neurology | Admitting: Physical Therapy

## 2019-06-10 DIAGNOSIS — R42 Dizziness and giddiness: Secondary | ICD-10-CM | POA: Insufficient documentation

## 2019-06-10 DIAGNOSIS — R2689 Other abnormalities of gait and mobility: Secondary | ICD-10-CM | POA: Insufficient documentation

## 2019-06-10 DIAGNOSIS — R2681 Unsteadiness on feet: Secondary | ICD-10-CM | POA: Insufficient documentation

## 2019-06-10 NOTE — Patient Instructions (Signed)
Gaze Stabilization: Standing Feet Apart    Feet shoulder width apart, keeping eyes on target on wall _6___ feet away, tilt head down 15-30 and move head side to side for _60__ seconds. Repeat while moving head up and down for _60___ seconds. Do _3-5_ sessions per day. Repeat using target on pattern background.   Gaze Stabilization: Tip Card  1.Target must remain in focus, not blurry, and appear stationary while head is in motion. 2.Perform exercises with small head movements (45 to either side of midline). 3.Increase speed of head motion so long as target is in focus. 4.If you wear eyeglasses, be sure you can see target through lens (therapist will give specific instructions for bifocal / progressive lenses). 5.These exercises may provoke dizziness or nausea. Work through these symptoms. If too dizzy, slow head movement slightly. Rest between each exercise. 6.Exercises demand concentration; avoid distractions. 7.For safety, perform standing exercises close to a counter, wall, corner, or next to someone.   

## 2019-06-11 ENCOUNTER — Encounter: Payer: Self-pay | Admitting: Physical Therapy

## 2019-06-11 NOTE — Therapy (Signed)
[facility] [address] [place], [place], [address] Phone: [phone]   Fax:  [phone]  Physical Therapy Evaluation  Patient Details  Name: Jessica [name] MRN: [id number] Date of Birth: October 11, 1940 Referring Provider (PT): Gaye Alken, NP   Encounter Date: 06/10/2019  PT End of Session - 06/11/19 1008    Visit Number  1    Number of Visits  9    Date for PT Re-Evaluation  08/16/19    Authorization Type  06-10-19 - 09-07-19    Authorization Time Period  [employer]    PT Start Time  0932    PT Stop Time  1015    PT Time Calculation (min)  43 min    Activity Tolerance  Patient tolerated treatment well    Behavior During Therapy  [facility] for tasks assessed/performed       Past Medical History:  Diagnosis Date  . Arthritis    oa  . Dizziness    related seeing vestibular md for inner ear  . Hypothyroidism     Past Surgical History:  Procedure Laterality Date  . ANTERIOR CERVICAL DECOMP/DISCECTOMY FUSION  12/13/2011   Procedure: ANTERIOR CERVICAL DECOMPRESSION/DISCECTOMY FUSION 3 LEVELS;  Surgeon: Temple Pacini, MD;  Location: [facility];  Service: Neurosurgery;  Laterality: N/A;  Cervical Five-Six Cervical Six-Seven Cervical Seven-Thoracic One Anterior cervical decompression/diskectomy/fusion/plating/allograft  . BREAST BIOPSY Right 2000  . CARPAL TUNNEL RELEASE     right  . EYE SURGERY     cataract bilateral  . FOOT ARTHRODESIS Left 06/25/2015   Procedure: HALLUX INTERPHALANGEAL JOINT  ARTHRODESIS;  Surgeon: Toni Arthurs, MD;  Location: [facility];  Service: Orthopedics;  Laterality: Left;  Marland Kitchen GASTROCNEMIUS RECESSION Left 06/25/2015   Procedure: LEFT GASTROC RECESSION ;  Surgeon: Toni Arthurs, MD;  Location: [facility];  Service: Orthopedics;  Laterality: Left;  . HIP FRACTURE SURGERY Left   . TENDON REPAIR Left 06/25/2015   Procedure: LEFT ANTERIOR TIBIAL TENDON RECONSTRUCTION;  EXTENSOR  HALLUCIS LONGUS TRANSFER ;  Surgeon: Toni Arthurs, MD;  Location: [facility];  Service: Orthopedics;  Laterality: Left;  . TOTAL KNEE ARTHROPLASTY Right 01/25/2016   Procedure: RIGHT TOTAL KNEE ARTHROPLASTY;  Surgeon: Durene Romans, MD;  Location: [facility];  Service: Orthopedics;  Laterality: Right;  . TUBAL LIGATION      There were no vitals filed for this visit.   Subjective Assessment - 06/10/19 0940    Subjective  pt states initial episode of dizziness occurred in 2017 in grocery store;  in 2019 states she was cleaning bathroom - fainted and hit her head - went to ED; x-ray showed fracture of odontoid process - wore cervical collar from April - August in 2019;  got up and went to bathroom in June 2019 - passed out and fractured Lt hip; was referred to cardiologist - wore heart monitor which showed nothing;  most recent episode of spinning vertigo occurred last Wednesday - took Meclizine this am   started using [facility] after hip fracture in June 2019   Pertinent History  cervical fusion 2013, Lt foot arthrodesis May 2017, Lt hip fracture sx June 2019 due to fall, Lt anterior tibial tendon reconstruction May 2017, Rt TKA Dec. 2017, cervical spinal stenosis, Controlled type 2 DM, syncope, sensorineural hearing los of Lt ear, orthostatic hypotension dysautonomic syndrome    Diagnostic tests  MRI with & without contrast (March 2021) - no acute findings    Patient  Stated Goals  "be reliably stable" - pt wants to leave house but states she has dizzy spell and has "anxiety in small spaces"    Currently in Pain?  Other (Comment)   head discomfort - head feels heavy; stiffness in Lt upper trap        [facility] PT Assessment - 06/11/19 0001      Assessment   Medical Diagnosis  Vertigo    Referring Provider (PT)  Laverna Peace, NP    Onset Date/Surgical Date  --   initial episode in 2017   Prior Therapy  pt states she has had PT in [place] in the past - had dry needling for her neck        Precautions   Precautions  Fall      Balance Screen   Has the patient fallen in the past 6 months  No    Has the patient had a decrease in activity level because of a fear of falling?   No    Is the patient reluctant to leave their home because of a fear of falling?   Yes      [place] residence    Type of [place] entrance      Prior Function   Level of [place]   does not leave home alone    Leisure  reads and sews           Vestibular Assessment - 06/11/19 0001      Vestibular Assessment   General Observation  pt is an 79 yr old lady with c/o intermittent spontaneous episodes of vertigo (etiology unknown) - pt states she has had syncopal episodes but cardiology work up has been done and no abnormalities found.  Pt is using SPC for assistance with ambulation - states she does not use this device in her home environment       Symptom Behavior   Subjective history of current problem  pt reports initial episode of vertigo occurred in 2017     Type of Dizziness   Spinning;Imbalance;Unsteady with head/body turns;Lightheadedness;"Funny feeling in head";"World moves"   feels that she is still & world moves around her    Frequency of Dizziness  varies - pt reports episode on Wed., 4-14 that lasted for several minutes - then impacts balance for several hours     Duration of Dizziness  seconds to minutes    Symptom Nature  Spontaneous;Variable    Aggravating Factors  Spontaneous onset    Relieving Factors  Lying supine    Progression of Symptoms  Better    History of similar episodes  pt reports episodes of shorter duration and not as severe       Oculomotor Exam   Oculomotor Alignment  Normal    Spontaneous  Absent    Smooth Pursuits  Intact    Saccades  Intact      Oculomotor Exam-Fixation Suppressed    Ocular Alignment  normal      Visual Acuity   Static  line 9    Dynamic  line 8   c/o  dizziness when testing stopped     Positional Testing   Sidelying Test  Sidelying Right;Sidelying Left      Sidelying Right   Sidelying Right Duration  none    Sidelying Right Symptoms  No nystagmus      Sidelying Left   Sidelying Left Duration  none    Sidelying Left Symptoms  No nystagmus      Positional Sensitivities   Sit to Supine  No dizziness    Supine to Left Side  No dizziness    Supine to Right Side  No dizziness    Supine to Sitting  Lightheadedness    Head Turning x 5  Mild dizziness    Head Nodding x 5  Moderate dizziness    Positional Sensitivities Comments  pt took Meclizine prior to appt - symptoms may be masked/suppressed          Objective measurements completed on examination: See above findings.              PT Education - 06/11/19 1007    Education Details  x1 viewing in standing    Person(s) Educated  Patient    Methods  Explanation;Demonstration;Handout    Comprehension  Verbalized understanding;Returned demonstration       PT Short Term Goals - 06/11/19 1218      PT SHORT TERM GOAL #1   Title  Pt will subjectively report at least 25% improvement in vertigo.    Time  4    Period  Weeks    Status  New    Target Date  07/12/19      PT SHORT TERM GOAL #2   Title  Improve DHI from 46% to </= 30% for improvement in vertigo for improved quality of life.    Baseline  46%    Time  4    Period  Weeks    Status  New    Target Date  07/12/19      PT SHORT TERM GOAL #3   Title  Complete SOT and establish goal as appropriate.    Time  4    Period  Weeks    Status  New    Target Date  07/12/19      PT SHORT TERM GOAL #4   Title  Independent in HEP for balance and vestibular exs.    Time  4    Period  Weeks    Status  New    Target Date  07/12/19        PT Long Term Goals - 06/11/19 1223      PT LONG TERM GOAL #1   Title  Improve DHI from 46% to </= 24% for improved vertigo for improved quality of life.    Baseline  46% on  06-10-19    Time  8    Period  Weeks    Status  New    Target Date  07/12/19      PT LONG TERM GOAL #2   Title  Improve composite SOT score by at least 10 points to demo improved balance and vestibular input in maintaining balance.    Time  8    Period  Weeks    Status  New    Target Date  07/12/19      PT LONG TERM GOAL #3   Title  Amb. 60' with horizontal head turns with SPC without LOB for improved safety with environmental scanning.    Time  8    Period  Weeks    Status  New    Target Date  07/12/19      PT LONG TERM GOAL #4   Title  Independent in updated HEP for balance & vestibular exercises.    Time  8    Period  Weeks  Status  New    Target Date  07/12/19             Plan - 06/11/19 1149    Clinical Impression Statement  Pt is an 79 yr old lady with c/o episodic vertigo that initially started in 2017, with pt having syncopal episodes that have resulted in injurious falls.  Pt states the episodes have gotten less in intensity and of shorter duration.  Pt states she took Meclizine prior to today's eval so some symptoms may be masked or suppressed by this medication - some of pt's symptoms are characteristic of BPPV.  DVA WNL's with a 1 line difference but pt reported dizziness after testing.  Pt will benefit from vestibular and balance exercises to address dizziness, dysequilibrium and unsteadiness.    Personal Factors and Comorbidities  Past/Current Experience;Comorbidity 2;Time since onset of injury/illness/exacerbation    Examination-Activity Limitations  Locomotion Level;Transfers;Bend;Stand;Stairs;Squat;Reach Overhead    Examination-Participation Restrictions  Meal Prep;Cleaning;Community Activity;Driving;Volunteer;Laundry;Shop    Stability/Clinical Decision Making  Evolving/Moderate complexity    Clinical Decision Making  Moderate    Rehab Potential  Good    PT Frequency  1x / week    PT Duration  8 weeks    PT Treatment/Interventions  ADLs/Self Care Home  Management;Gait training;DME Instruction;Neuromuscular re-education;Balance training;Therapeutic exercise;Therapeutic activities;Patient/family education;Vestibular    PT Next Visit Plan  recheck Dix-Hallpike tests (no Meclizine); orthostatics; check x 1 viewing    PT Home Exercise Plan  x1 viewing    Consulted and Agree with Plan of Care  Patient       Patient will benefit from skilled therapeutic intervention in order to improve the following deficits and impairments:  Difficulty walking, Dizziness, Decreased balance, Pain  Visit Diagnosis: Dizziness and giddiness - Plan: PT plan of care cert/re-cert  Other abnormalities of gait and mobility - Plan: PT plan of care cert/re-cert  Unsteadiness on feet - Plan: PT plan of care cert/re-cert     Problem List Patient Active Problem List   Diagnosis Date Noted  . Tremor observed on examination 04/25/2019  . Multiple falls 04/25/2019  . 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 Orthostatic hypotension dysautonomic syndrome (HCC) 04/25/2019  . Auditory vertigo 04/25/2019  . Post-traumatic headache, not intractable 04/25/2019  . Essential hypertension 11/23/2018  . Hypothyroidism 08/21/2017  . Hyperlipidemia 08/21/2017  . Controlled type 2 diabetes mellitus with diabetic nephropathy, without long-term current use of insulin (HCC) 08/21/2017  . Syncope 08/21/2017  . 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 Cervical spinal stenosis 12/13/2011    Kary Kos, PT 06/11/2019, 12:39 PM  [facility] [address] [place], [place], [address] Phone: [phone]   Fax:  [phone]  Name: Jessica [name] MRN: [id number] Date of Birth: 06-08-1940

## 2019-06-13 ENCOUNTER — Ambulatory Visit: Payer: Medicare PPO | Admitting: Cardiology

## 2019-06-13 ENCOUNTER — Other Ambulatory Visit: Payer: Self-pay

## 2019-06-13 ENCOUNTER — Encounter: Payer: Self-pay | Admitting: Cardiology

## 2019-06-13 VITALS — BP 151/83 | HR 75 | Temp 97.0°F | Ht 68.5 in | Wt 179.0 lb

## 2019-06-13 DIAGNOSIS — H81313 Aural vertigo, bilateral: Secondary | ICD-10-CM | POA: Diagnosis not present

## 2019-06-13 DIAGNOSIS — E782 Mixed hyperlipidemia: Secondary | ICD-10-CM

## 2019-06-13 DIAGNOSIS — I10 Essential (primary) hypertension: Secondary | ICD-10-CM

## 2019-06-13 NOTE — Progress Notes (Signed)
Cardiology Office Note:    Date:  06/13/2019   ID:  Jessica [name], DOB May 29, 1940, MRN [id number]  PCP:  Lowella Dandy, NP  Cardiologist:  Jenean Lindau, MD   Referring MD: Lowella Dandy, NP    ASSESSMENT:    1. Essential hypertension   2. Mixed hyperlipidemia   3. Auditory vertigo involving both ears    PLAN:    In order of problems listed above:  1. Primary prevention stressed with the patient.  Importance of compliance with diet medication stressed and she vocalized understanding. 2. Essential hypertension: Blood pressure is stable she has an element of whitecoat hypertension.  I do not want to pursue lowering blood pressure aggressively because of issues with hypotension that possible. 3. Mixed dyslipidemia: Lipids are acceptable.  I reviewed KPN sheet.  Diet was emphasized 4. Vertigo: Her vertigo issues are very significant and affecting quality of life.  I discussed with with her and told her to get a referral to a tertiary care center like [facility] to see if they can help her to this extent.  She is agreeable.  This is managed by her primary care physician. 5. Patient will be seen in follow-up appointment in 6 months or earlier if the patient has any concerns    Medication Adjustments/Labs and Tests Ordered: Current medicines are reviewed at length with the patient today.  Concerns regarding medicines are outlined above.  No orders of the defined types were placed in this encounter.  No orders of the defined types were placed in this encounter.    No chief complaint on file.    History of Present Illness:    Jessica [name] is a 79 y.o. female.  Patient has past medical history of essential hypertension and dyslipidemia.  She has diabetes mellitus.  She denies any problems at this time and takes care of activities of daily living from a cardiovascular standpoint.  No chest pain orthopnea or PND.  She is troubled with refractory vertigo.  She has sought multiple  evaluations from this including ENT and neurology.  At the time of my evaluation, the patient is alert awake oriented and in no distress.  Past Medical History:  Diagnosis Date  . Arthritis    oa  . Auditory vertigo 04/25/2019  . Cervical spinal stenosis 12/13/2011  . Controlled type 2 diabetes mellitus with diabetic nephropathy, without long-term current use of insulin ([place]) 08/21/2017  . Dizziness    related seeing vestibular md for inner ear  . Essential hypertension 11/23/2018  . Hyperlipidemia 08/21/2017  . Hypothyroidism   . Multiple falls 04/25/2019  . Orthostatic hypotension dysautonomic syndrome ([place]) 04/25/2019  . Post-traumatic headache, not intractable 04/25/2019  . 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 Syncope 08/21/2017  . Tremor observed on examination 04/25/2019    Past Surgical History:  Procedure Laterality Date  . ANTERIOR CERVICAL DECOMP/DISCECTOMY FUSION  12/13/2011   Procedure: ANTERIOR CERVICAL DECOMPRESSION/DISCECTOMY FUSION 3 LEVELS;  Surgeon: Charlie Pitter, MD;  Location: [facility];  Service: Neurosurgery;  Laterality: N/A;  Cervical Five-Six Cervical Six-Seven Cervical Seven-Thoracic One Anterior cervical decompression/diskectomy/fusion/plating/allograft  . BREAST BIOPSY Right 2000  . CARPAL TUNNEL RELEASE     right  . EYE SURGERY     cataract bilateral  . FOOT ARTHRODESIS Left 06/25/2015   Procedure: HALLUX INTERPHALANGEAL JOINT  ARTHRODESIS;  Surgeon: Wylene Simmer, MD;  Location: [place];  Service:  Orthopedics;  Laterality: Left;  Marland Kitchen GASTROCNEMIUS RECESSION Left 06/25/2015   Procedure: LEFT GASTROC RECESSION ;  Surgeon: Toni Arthurs, MD;  Location: [facility];  Service: Orthopedics;  Laterality: Left;  . HIP FRACTURE SURGERY Left   . TENDON REPAIR Left 06/25/2015   Procedure: LEFT ANTERIOR TIBIAL TENDON RECONSTRUCTION;  EXTENSOR HALLUCIS LONGUS TRANSFER ;  Surgeon: Toni Arthurs, MD;   Location: [facility];  Service: Orthopedics;  Laterality: Left;  . TOTAL KNEE ARTHROPLASTY Right 01/25/2016   Procedure: RIGHT TOTAL KNEE ARTHROPLASTY;  Surgeon: Durene Romans, MD;  Location: [facility];  Service: Orthopedics;  Laterality: Right;  . TUBAL LIGATION      Current Medications: Current Meds  Medication Sig  . Calcium Carb-Cholecalciferol (CALCIUM 500+D PO) Take 1 tablet by mouth daily.  . citalopram (CELEXA) 10 MG tablet Take 10 mg by mouth at bedtime.  . ergocalciferol (VITAMIN D2) 50000 UNITS capsule Take 50,000 Units by mouth once a week. Due on 10/15   . FORTEO 600 MCG/2.4ML SOPN ADM 0.08 ML [place] D  . levothyroxine (SYNTHROID) 100 MCG tablet TK 1 T PO D  . meclizine (ANTIVERT) 25 MG tablet Take 25 mg by mouth as needed for dizziness.  . metoprolol succinate (TOPROL-XL) 25 MG 24 hr tablet TAKE 1 TABLET(25 MG) BY MOUTH DAILY  . Multiple Vitamin (MULTIVITAMIN WITH MINERALS) TABS tablet Take 7 tablets by mouth daily. [place] Multivitamin regimen  . rosuvastatin (CRESTOR) 5 MG tablet TK 1 T PO 2 TIMES A WK     Allergies:   Patient has no known allergies.   Social History   Socioeconomic History  . Marital status: Widowed    Spouse name: Not on file  . Number of children: Not on file  . Years of education: Not on file  . Highest education level: Not on file  Occupational History  . Not on file  Tobacco Use  . Smoking status: Never Smoker  . Smokeless tobacco: Never Used  Substance and Sexual Activity  . Alcohol use: Not Currently    Comment: rarely  . Drug use: No  . Sexual activity: Never  Other Topics Concern  . Not on file  Social History Narrative  . Not on file   Social Determinants of Health   Financial Resource Strain:   . Difficulty of Paying Living Expenses:   Food Insecurity:   . Worried About Programme researcher, broadcasting/film/video in the Last Year:   . Barista in the Last Year:   Transportation Needs:   . Freight forwarder (Medical):   Marland Kitchen  Lack of Transportation (Non-Medical):   Physical Activity:   . Days of Exercise per Week:   . Minutes of Exercise per Session:   Stress:   . Feeling of Stress :   Social Connections:   . Frequency of Communication with Friends and Family:   . Frequency of Social Gatherings with Friends and Family:   . Attends Religious Services:   . Active Member of Clubs or Organizations:   . Attends Banker Meetings:   Marland Kitchen Marital Status:      Family History: The patient's family history includes Dementia in her mother; Osteoporosis in her mother; Other in her father; Prostate cancer in her maternal grandfather.  ROS:   Please see the history of present illness.    All other systems reviewed and are negative.  EKGs/Labs/Other Studies Reviewed:    The following studies were reviewed today: EKG done today  reveals sinus rhythm and nonspecific ST-T changes. I discussed my findings with the patient at length blood work from the [facility] sheet was reviewed   Recent Labs: 04/25/2019: ALT 29; BUN 20; Creatinine, Ser 0.89; Potassium 4.5; Sodium 135  Recent Lipid Panel No results found for: CHOL, TRIG, HDL, CHOLHDL, VLDL, LDLCALC, LDLDIRECT  Physical Exam:    VS:  BP (!) 151/83 (BP Location: Left Arm, Cuff Size: Normal)   Pulse 75   Temp (!) 97 F (36.1 C)   Ht 5' 8.5" (1.74 m)   Wt 179 lb (81.2 kg)   SpO2 96%   BMI 26.82 kg/m     Wt Readings from Last 3 Encounters:  06/13/19 179 lb (81.2 kg)  04/25/19 178 lb (80.7 kg)  11/23/18 173 lb (78.5 kg)     GEN: Patient is in no acute distress HEENT: Normal NECK: No JVD; No carotid bruits LYMPHATICS: No lymphadenopathy CARDIAC: Hear sounds regular, 2/6 systolic murmur at the apex. RESPIRATORY:  Clear to auscultation without rales, wheezing or rhonchi  ABDOMEN: Soft, non-tender, non-distended MUSCULOSKELETAL:  No edema; No deformity  SKIN: Warm and dry NEUROLOGIC:  Alert and oriented x 3 PSYCHIATRIC:  Normal affect   Signed, Garwin Brothers, MD  06/13/2019 10:11 AM    [facility]

## 2019-06-13 NOTE — Patient Instructions (Signed)

## 2019-06-15 ENCOUNTER — Other Ambulatory Visit: Payer: Self-pay | Admitting: Family

## 2019-06-18 ENCOUNTER — Encounter: Payer: Self-pay | Admitting: Physical Therapy

## 2019-06-18 ENCOUNTER — Other Ambulatory Visit: Payer: Self-pay

## 2019-06-18 ENCOUNTER — Ambulatory Visit: Payer: Medicare PPO | Admitting: Physical Therapy

## 2019-06-18 VITALS — BP 161/76 | HR 68

## 2019-06-18 DIAGNOSIS — R2689 Other abnormalities of gait and mobility: Secondary | ICD-10-CM | POA: Diagnosis not present

## 2019-06-18 DIAGNOSIS — R42 Dizziness and giddiness: Secondary | ICD-10-CM | POA: Diagnosis not present

## 2019-06-18 DIAGNOSIS — R2681 Unsteadiness on feet: Secondary | ICD-10-CM | POA: Diagnosis not present

## 2019-06-18 NOTE — Therapy (Signed)
[facility] [address] [place], [place], [address] Phone: [phone]   Fax:  [phone]  Physical Therapy Treatment  Patient Details  Name: Jessica [name] MRN: [id number] Date of Birth: 1940-03-04 Referring Provider (PT): Gaye Alken, NP   Encounter Date: 06/18/2019  PT End of Session - 06/18/19 2014    Visit Number  2    Number of Visits  9    Date for PT Re-Evaluation  08/16/19    Authorization Type  06-10-19 - 09-07-19    Authorization Time Period  [employer]    PT Start Time  6222    PT Stop Time  0800    PT Time Calculation (min)  44 min    Activity Tolerance  Patient tolerated treatment well    Behavior During Therapy  [facility] for tasks assessed/performed       Past Medical History:  Diagnosis Date  . Arthritis    oa  . Auditory vertigo 04/25/2019  . Cervical spinal stenosis 12/13/2011  . Controlled type 2 diabetes mellitus with diabetic nephropathy, without long-term current use of insulin (HCC) 08/21/2017  . Dizziness    related seeing vestibular md for inner ear  . Essential hypertension 11/23/2018  . Hyperlipidemia 08/21/2017  . Hypothyroidism   . Multiple falls 04/25/2019  . Orthostatic hypotension dysautonomic syndrome (HCC) 04/25/2019  . Post-traumatic headache, not intractable 04/25/2019  . 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 Syncope 08/21/2017  . Tremor observed on examination 04/25/2019    Past Surgical History:  Procedure Laterality Date  . ANTERIOR CERVICAL DECOMP/DISCECTOMY FUSION  12/13/2011   Procedure: ANTERIOR CERVICAL DECOMPRESSION/DISCECTOMY FUSION 3 LEVELS;  Surgeon: Temple Pacini, MD;  Location: [facility];  Service: Neurosurgery;  Laterality: N/A;  Cervical Five-Six Cervical Six-Seven Cervical Seven-Thoracic One Anterior cervical decompression/diskectomy/fusion/plating/allograft  . BREAST BIOPSY Right 2000  . CARPAL TUNNEL RELEASE     right  . EYE SURGERY     cataract bilateral  . FOOT ARTHRODESIS Left 06/25/2015   Procedure: HALLUX INTERPHALANGEAL JOINT  ARTHRODESIS;  Surgeon: Toni Arthurs, MD;  Location: [facility];  Service: Orthopedics;  Laterality: Left;  Marland Kitchen GASTROCNEMIUS RECESSION Left 06/25/2015   Procedure: LEFT GASTROC RECESSION ;  Surgeon: Toni Arthurs, MD;  Location: [facility];  Service: Orthopedics;  Laterality: Left;  . HIP FRACTURE SURGERY Left   . TENDON REPAIR Left 06/25/2015   Procedure: LEFT ANTERIOR TIBIAL TENDON RECONSTRUCTION;  EXTENSOR HALLUCIS LONGUS TRANSFER ;  Surgeon: Toni Arthurs, MD;  Location: [facility];  Service: Orthopedics;  Laterality: Left;  . TOTAL KNEE ARTHROPLASTY Right 01/25/2016   Procedure: RIGHT TOTAL KNEE ARTHROPLASTY;  Surgeon: Durene Romans, MD;  Location: [facility];  Service: Orthopedics;  Laterality: Right;  . TUBAL LIGATION      Vitals:   06/18/19 0729  BP: (!) 161/76  Pulse: 68    Subjective Assessment - 06/18/19 0719    Subjective  Pt states she had onset of dizziness last night around 8:00 yesterday evening - feels "like I'm sitting right on the edge"; had a spin on Friday - only 2 episodes of dizziness since she was here last week at eval   pt states she forgot and took Meclizine yesterday am as precaution for preventing episode of vertigo   Pertinent History  cervical fusion 2013, Lt foot arthrodesis May 2017, Lt hip fracture sx June 2019 due to fall, Lt anterior  tibial tendon reconstruction May 2017, Rt TKA Dec. 2017, cervical spinal stenosis, Controlled type 2 DM, syncope, sensorineural hearing los of Lt ear, orthostatic hypotension dysautonomic syndrome    Diagnostic tests  MRI with & without contrast (March 2021) - no acute findings    Patient Stated Goals  "be reliably stable" - pt wants to leave house but states she has dizzy spell and has "anxiety in small spaces"    Currently in Pain?  No/denies             Vestibular  Assessment - 06/18/19 0001      Visual Acuity   Static  line 9    Dynamic  line 7   c/o dizziness when test completed     Positional Testing   Dix-Hallpike  Dix-Hallpike Right;Dix-Hallpike Left    Sidelying Test  Sidelying Right;Sidelying Left      Dix-Hallpike Right   Dix-Hallpike Right Duration  none    Dix-Hallpike Right Symptoms  No nystagmus      Dix-Hallpike Left   Dix-Hallpike Left Duration  none    Dix-Hallpike Left Symptoms  No nystagmus      Sidelying Right   Sidelying Right Duration  none    Sidelying Right Symptoms  No nystagmus      Sidelying Left   Sidelying Left Duration  none    Sidelying Left Symptoms  No nystagmus      Orthostatics   BP supine (x 5 minutes)  131/71    HR supine (x 5 minutes)  63    BP sitting  166/96   c/o light-headedness with supine to sit   HR sitting  75    BP standing (after 1 minute)  182/99    HR standing (after 1 minute)  73    BP standing (after 3 minutes)  179/94    HR standing (after 3 minutes)  70    Orthostatics Comment  lying supine at end of assessment  - 151/81;  HR 64; supine to sit 169/95 ; HR 67:  standing 167/85;  HR 68                [facility] Adult PT Treatment/Exercise - 06/18/19 0001      Ambulation/Gait   Ambulation Distance (Feet)  35 Feet    Gait Pattern  Within Functional Limits    Ambulation Surface  Level;Indoor    Gait Comments  horizontal head turns with minimal deviation in path - c/o not feeling comfortable around crowds              PT Education - 06/18/19 2013    Education Details  recommended that pt record BP when she is having dizziness at home    Person(s) Educated  Patient    Methods  Explanation    Comprehension  Verbalized understanding       PT Short Term Goals - 06/18/19 2024      PT SHORT TERM GOAL #1   Title  Pt will subjectively report at least 25% improvement in vertigo.    Time  4    Period  Weeks    Status  New    Target Date  07/12/19      PT SHORT TERM  GOAL #2   Title  Improve DHI from 46% to </= 30% for improvement in vertigo for improved quality of life.    Baseline  46%    Time  4    Period  Weeks    Status  New    Target Date  07/12/19      PT SHORT TERM GOAL #3   Title  Complete SOT and establish goal as appropriate.    Time  4    Period  Weeks    Status  New    Target Date  07/12/19      PT SHORT TERM GOAL #4   Title  Independent in HEP for balance and vestibular exs.    Time  4    Period  Weeks    Status  New    Target Date  07/12/19        PT Long Term Goals - 06/18/19 2024      PT LONG TERM GOAL #1   Title  Improve DHI from 46% to </= 24% for improved vertigo for improved quality of life.    Baseline  46% on 06-10-19    Time  8    Period  Weeks    Status  New      PT LONG TERM GOAL #2   Title  Improve composite SOT score by at least 10 points to demo improved balance and vestibular input in maintaining balance.    Time  8    Period  Weeks    Status  New      PT LONG TERM GOAL #3   Title  Amb. 58' with horizontal head turns with SPC without LOB for improved safety with environmental scanning.    Time  8    Period  Weeks    Status  New      PT LONG TERM GOAL #4   Title  Independent in updated HEP for balance & vestibular exercises.    Time  8    Period  Weeks    Status  New            Plan - 06/18/19 0962    Clinical Impression Statement  Pt's BP significantly increases with transfers supine to sit and with sit to stand; typically BP decreases with these transfers.  Pt c/o moderate light-headedness with transitional movements.  Etiology of dizziness appears to be related to changes in BP with positional changes and transfers.    Personal Factors and Comorbidities  Past/Current Experience;Comorbidity 2;Time since onset of injury/illness/exacerbation    Examination-Activity Limitations  Locomotion Level;Transfers;Bend;Stand;Stairs;Squat;Reach Overhead    Examination-Participation Restrictions  Meal  Prep;Cleaning;Community Activity;Driving;Volunteer;Laundry;Shop    Stability/Clinical Decision Making  Evolving/Moderate complexity    Rehab Potential  Good    PT Frequency  1x / week    PT Duration  8 weeks    PT Treatment/Interventions  ADLs/Self Care Home Management;Gait training;DME Instruction;Neuromuscular re-education;Balance training;Therapeutic exercise;Therapeutic activities;Patient/family education;Vestibular    PT Next Visit Plan  do TUG; balance on foam, balance HEP at counter - head turns    PT Home Exercise Plan  x1 viewing    Consulted and Agree with Plan of Care  Patient       Patient will benefit from skilled therapeutic intervention in order to improve the following deficits and impairments:  Difficulty walking, Dizziness, Decreased balance, Pain  Visit Diagnosis: Dizziness and giddiness  Other abnormalities of gait and mobility     Problem List Patient Active Problem List   Diagnosis Date Noted  . Tremor observed on examination 04/25/2019  . Multiple falls 04/25/2019  . 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 Orthostatic hypotension dysautonomic syndrome ([place]) 04/25/2019  . Auditory vertigo 04/25/2019  . Post-traumatic headache, not  intractable 04/25/2019  . Essential hypertension 11/23/2018  . Hypothyroidism 08/21/2017  . Hyperlipidemia 08/21/2017  . Controlled type 2 diabetes mellitus with diabetic nephropathy, without long-term current use of insulin (HCC) 08/21/2017  . Syncope 08/21/2017  . 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 Cervical spinal stenosis 12/13/2011    Kary Kos, PT 06/18/2019, 8:25 PM  [facility] [address] [place], [place], [address] Phone: [phone]   Fax:  [phone]  Name: GAYLIA KASSEL MRN: [id number] Date of Birth: December 20, 1940

## 2019-06-25 ENCOUNTER — Other Ambulatory Visit: Payer: Self-pay

## 2019-06-25 ENCOUNTER — Ambulatory Visit: Payer: Medicare PPO | Attending: Neurology | Admitting: Physical Therapy

## 2019-06-25 ENCOUNTER — Encounter: Payer: Self-pay | Admitting: Physical Therapy

## 2019-06-25 DIAGNOSIS — R2689 Other abnormalities of gait and mobility: Secondary | ICD-10-CM | POA: Insufficient documentation

## 2019-06-25 DIAGNOSIS — R2681 Unsteadiness on feet: Secondary | ICD-10-CM | POA: Diagnosis not present

## 2019-06-25 DIAGNOSIS — R42 Dizziness and giddiness: Secondary | ICD-10-CM | POA: Diagnosis not present

## 2019-06-26 NOTE — Therapy (Signed)
[facility] [address] [place], [place], [address] Phone: [phone]   Fax:  [phone]  Physical Therapy Treatment  Patient Details  Name: Jessica [name] MRN: [id number] Date of Birth: 14-Mar-1940 Referring Provider (PT): Gaye Alken, NP   Encounter Date: 06/25/2019  PT End of Session - 06/26/19 1538    Visit Number  3    Number of Visits  9    Date for PT Re-Evaluation  08/16/19    Authorization Type  06-10-19 - 09-07-19    Authorization Time Period  [employer]    PT Start Time  0847    PT Stop Time  0933    PT Time Calculation (min)  46 min    Activity Tolerance  Patient tolerated treatment well    Behavior During Therapy  [facility] for tasks assessed/performed       Past Medical History:  Diagnosis Date  . Arthritis    oa  . Auditory vertigo 04/25/2019  . Cervical spinal stenosis 12/13/2011  . Controlled type 2 diabetes mellitus with diabetic nephropathy, without long-term current use of insulin (HCC) 08/21/2017  . Dizziness    related seeing vestibular md for inner ear  . Essential hypertension 11/23/2018  . Hyperlipidemia 08/21/2017  . Hypothyroidism   . Multiple falls 04/25/2019  . Orthostatic hypotension dysautonomic syndrome (HCC) 04/25/2019  . Post-traumatic headache, not intractable 04/25/2019  . 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 Syncope 08/21/2017  . Tremor observed on examination 04/25/2019    Past Surgical History:  Procedure Laterality Date  . ANTERIOR CERVICAL DECOMP/DISCECTOMY FUSION  12/13/2011   Procedure: ANTERIOR CERVICAL DECOMPRESSION/DISCECTOMY FUSION 3 LEVELS;  Surgeon: Temple Pacini, MD;  Location: [facility];  Service: Neurosurgery;  Laterality: N/A;  Cervical Five-Six Cervical Six-Seven Cervical Seven-Thoracic One Anterior cervical decompression/diskectomy/fusion/plating/allograft  . BREAST BIOPSY Right 2000  . CARPAL TUNNEL RELEASE      right  . EYE SURGERY     cataract bilateral  . FOOT ARTHRODESIS Left 06/25/2015   Procedure: HALLUX INTERPHALANGEAL JOINT  ARTHRODESIS;  Surgeon: Toni Arthurs, MD;  Location: [facility];  Service: Orthopedics;  Laterality: Left;  Marland Kitchen GASTROCNEMIUS RECESSION Left 06/25/2015   Procedure: LEFT GASTROC RECESSION ;  Surgeon: Toni Arthurs, MD;  Location: [facility];  Service: Orthopedics;  Laterality: Left;  . HIP FRACTURE SURGERY Left   . TENDON REPAIR Left 06/25/2015   Procedure: LEFT ANTERIOR TIBIAL TENDON RECONSTRUCTION;  EXTENSOR HALLUCIS LONGUS TRANSFER ;  Surgeon: Toni Arthurs, MD;  Location: [facility];  Service: Orthopedics;  Laterality: Left;  . TOTAL KNEE ARTHROPLASTY Right 01/25/2016   Procedure: RIGHT TOTAL KNEE ARTHROPLASTY;  Surgeon: Durene Romans, MD;  Location: [facility];  Service: Orthopedics;  Laterality: Right;  . TUBAL LIGATION      There were no vitals filed for this visit.  Subjective Assessment - 06/25/19 0853    Subjective  Pt states she had a really good week until yesterday - rode stationery bike yesterday for about 1/2 mile and then had a dizzy spell that lasted about 4" - drank a glass of water and felt better after sitting on side of bed   pt states she forgot and took Meclizine yesterday am as precaution for preventing episode of vertigo   Pertinent History  cervical fusion 2013, Lt foot arthrodesis May 2017, Lt hip fracture sx June 2019 due to fall, Lt anterior tibial tendon  reconstruction May 2017, Rt TKA Dec. 2017, cervical spinal stenosis, Controlled type 2 DM, syncope, sensorineural hearing los of Lt ear, orthostatic hypotension dysautonomic syndrome    Diagnostic tests  MRI with & without contrast (March 2021) - no acute findings    Patient Stated Goals  "be reliably stable" - pt wants to leave house but states she has dizzy spell and has "anxiety in small spaces"    Currently in Pain?  No/denies                        [facility] Adult PT Treatment/Exercise - 06/26/19 0001      Transfers   Transfers  Sit to Stand;Stand to Sit    Sit to Stand  4: Min guard    Number of Reps  Other reps (comment)   5 reps   Comments  with mod. UE support from mat table - feet on Airex pad for incr. challenge with balance      Ambulation/Gait   Ambulation/Gait  Yes    Ambulation/Gait Assistance  5: Supervision    Ambulation/Gait Assistance Details  cues for incr. Lt initial heel contact    Ambulation Distance (Feet)  350 Feet    Assistive device  None    Gait Pattern  Within Functional Limits    Ambulation Surface  Level;Indoor      High Level Balance   High Level Balance Activities  Side stepping;Backward walking;Direction changes;Turns;Head turns          Balance Exercises - 06/26/19 1526      Balance Exercises: Standing   Standing Eyes Opened  Wide (BOA);Head turns;Foam/compliant surface;5 reps    Standing Eyes Closed  Wide (BOA);Head turns;Foam/compliant surface;Solid surface;5 reps    Rockerboard  Anterior/posterior;Head turns;EO;EC;10 reps;Intermittent UE support    Gait with Head Turns  Forward;3 reps   inside // bars - no UE support   Marching  Foam/compliant surface;Solid surface;Head turns;Dynamic;Static   inside // bars   Other Standing Exercises  forward kicks, side kicks 5 reps each standing on floor inside // bars with UE support prn    Other Standing Exercises Comments  pt performed head turns horizontal & vertical - standing on rocker board with UE support prn          PT Short Term Goals - 06/26/19 1542      PT SHORT TERM GOAL #1   Title  Pt will subjectively report at least 25% improvement in vertigo.    Time  4    Period  Weeks    Status  New    Target Date  07/12/19      PT SHORT TERM GOAL #2   Title  Improve DHI from 46% to </= 30% for improvement in vertigo for improved quality of life.    Baseline  46%    Time  4    Period  Weeks    Status   New    Target Date  07/12/19      PT SHORT TERM GOAL #3   Title  Complete SOT and establish goal as appropriate.    Time  4    Period  Weeks    Status  New    Target Date  07/12/19      PT SHORT TERM GOAL #4   Title  Independent in HEP for balance and vestibular exs.    Time  4    Period  Weeks    Status  New    Target Date  07/12/19        PT Long Term Goals - 06/26/19 1542      PT LONG TERM GOAL #1   Title  Improve DHI from 46% to </= 24% for improved vertigo for improved quality of life.    Baseline  46% on 06-10-19    Time  8    Period  Weeks    Status  New    Target Date  08/12/19      PT LONG TERM GOAL #2   Title  Improve composite SOT score by at least 10 points to demo improved balance and vestibular input in maintaining balance.    Time  8    Period  Weeks    Status  New    Target Date  08/12/19      PT LONG TERM GOAL #3   Title  Amb. 8' with horizontal head turns with SPC without LOB for improved safety with environmental scanning.    Time  8    Period  Weeks    Status  New    Target Date  08/12/19      PT LONG TERM GOAL #4   Title  Independent in updated HEP for balance & vestibular exercises.    Time  8    Period  Weeks    Status  New    Target Date  08/12/19            Plan - 06/26/19 1539    Clinical Impression Statement  Pt demonstrates vestibular hypofunction as pt c/o dizziness with standing on compliant surface with EC and also with visual tracking activities - making circles with ball - indicative of decreased VOR function.  Pt's gait improved at end of session with less foot flat noted with increased initial heel contact at stance phase of gait.  Pt stated that today's PT session "was a good session".    Personal Factors and Comorbidities  Past/Current Experience;Comorbidity 2;Time since onset of injury/illness/exacerbation    Examination-Activity Limitations  Locomotion Level;Transfers;Bend;Stand;Stairs;Squat;Reach Overhead     Examination-Participation Restrictions  Meal Prep;Cleaning;Community Activity;Driving;Volunteer;Laundry;Shop    Stability/Clinical Decision Making  Evolving/Moderate complexity    Rehab Potential  Good    PT Frequency  1x / week    PT Duration  8 weeks    PT Treatment/Interventions  ADLs/Self Care Home Management;Gait training;DME Instruction;Neuromuscular re-education;Balance training;Therapeutic exercise;Therapeutic activities;Patient/family education;Vestibular    PT Next Visit Plan  cont. gait/vestibular exercises    PT Home Exercise Plan  x1 viewing    Consulted and Agree with Plan of Care  Patient       Patient will benefit from skilled therapeutic intervention in order to improve the following deficits and impairments:  Difficulty walking, Dizziness, Decreased balance, Pain  Visit Diagnosis: Dizziness and giddiness  Other abnormalities of gait and mobility  Unsteadiness on feet     Problem List Patient Active Problem List   Diagnosis Date Noted  . Tremor observed on examination 04/25/2019  . Multiple falls 04/25/2019  . 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 Orthostatic hypotension dysautonomic syndrome ([place]) 04/25/2019  . Auditory vertigo 04/25/2019  . Post-traumatic headache, not intractable 04/25/2019  . Essential hypertension 11/23/2018  . Hypothyroidism 08/21/2017  . Hyperlipidemia 08/21/2017  . Controlled type 2 diabetes mellitus with diabetic nephropathy, without long-term current use of insulin ([place]) 08/21/2017  . Syncope 08/21/2017  . 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 Cervical spinal stenosis 12/13/2011  Kary Kos, PT 06/26/2019, 3:45 PM   [facility] [address] [place], [place], [address] Phone: [phone]   Fax:  [phone]  Name: ANAHI BELMAR MRN: [id number] Date of Birth: 02-03-1941

## 2019-07-01 ENCOUNTER — Ambulatory Visit: Payer: Medicare PPO | Admitting: Physical Therapy

## 2019-07-01 ENCOUNTER — Other Ambulatory Visit: Payer: Self-pay

## 2019-07-01 DIAGNOSIS — R42 Dizziness and giddiness: Secondary | ICD-10-CM | POA: Diagnosis not present

## 2019-07-01 DIAGNOSIS — R2681 Unsteadiness on feet: Secondary | ICD-10-CM

## 2019-07-01 DIAGNOSIS — R2689 Other abnormalities of gait and mobility: Secondary | ICD-10-CM | POA: Diagnosis not present

## 2019-07-02 ENCOUNTER — Encounter: Payer: Self-pay | Admitting: Physical Therapy

## 2019-07-02 NOTE — Therapy (Signed)
[facility] [address] [place], [place], [address] Phone: [phone]   Fax:  [phone]  Physical Therapy Treatment  Patient Details  Name: Jessica [name] MRN: [id number] Date of Birth: 1941-01-20 Referring Provider (PT): Laverna Peace, NP   Encounter Date: 07/01/2019  PT End of Session - 07/02/19 2045    Visit Number  4    Number of Visits  9    Date for PT Re-Evaluation  08/16/19    Authorization Type  06-10-19 - 09-07-19    Authorization Time Period  [employer]    PT Start Time  0803    PT Stop Time  0847    PT Time Calculation (min)  44 min    Activity Tolerance  Patient tolerated treatment well    Behavior During Therapy  [facility] for tasks assessed/performed       Past Medical History:  Diagnosis Date  . Arthritis    oa  . Auditory vertigo 04/25/2019  . Cervical spinal stenosis 12/13/2011  . Controlled type 2 diabetes mellitus with diabetic nephropathy, without long-term current use of insulin ([place]) 08/21/2017  . Dizziness    related seeing vestibular md for inner ear  . Essential hypertension 11/23/2018  . Hyperlipidemia 08/21/2017  . Hypothyroidism   . Multiple falls 04/25/2019  . Orthostatic hypotension dysautonomic syndrome ([place]) 04/25/2019  . Post-traumatic headache, not intractable 04/25/2019  . 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 Syncope 08/21/2017  . Tremor observed on examination 04/25/2019    Past Surgical History:  Procedure Laterality Date  . ANTERIOR CERVICAL DECOMP/DISCECTOMY FUSION  12/13/2011   Procedure: ANTERIOR CERVICAL DECOMPRESSION/DISCECTOMY FUSION 3 LEVELS;  Surgeon: Charlie Pitter, MD;  Location: [place] [facility];  Service: Neurosurgery;  Laterality: N/A;  Cervical Five-Six Cervical Six-Seven Cervical Seven-Thoracic One Anterior cervical decompression/diskectomy/fusion/plating/allograft  . BREAST BIOPSY Right 2000  . CARPAL TUNNEL RELEASE     right  . EYE SURGERY     cataract bilateral  . FOOT ARTHRODESIS Left 06/25/2015   Procedure: HALLUX INTERPHALANGEAL JOINT  ARTHRODESIS;  Surgeon: Wylene Simmer, MD;  Location: [place];  Service: Orthopedics;  Laterality: Left;  Marland Kitchen GASTROCNEMIUS RECESSION Left 06/25/2015   Procedure: LEFT GASTROC RECESSION ;  Surgeon: Wylene Simmer, MD;  Location: [place];  Service: Orthopedics;  Laterality: Left;  . HIP FRACTURE SURGERY Left   . TENDON REPAIR Left 06/25/2015   Procedure: LEFT ANTERIOR TIBIAL TENDON RECONSTRUCTION;  EXTENSOR HALLUCIS LONGUS TRANSFER ;  Surgeon: Wylene Simmer, MD;  Location: [place];  Service: Orthopedics;  Laterality: Left;  . TOTAL KNEE ARTHROPLASTY Right 01/25/2016   Procedure: RIGHT TOTAL KNEE ARTHROPLASTY;  Surgeon: Paralee Cancel, MD;  Location: [facility];  Service: Orthopedics;  Laterality: Right;  . TUBAL LIGATION      There were no vitals filed for this visit.                    [facility] Adult PT Treatment/Exercise - 07/02/19 0001      Transfers   Transfers  Sit to Stand;Stand to Sit    Number of Reps  Other reps (comment)   5 reps   Comments  feet on Airex pad      Ambulation/Gait   Ambulation/Gait  Yes    Ambulation/Gait Assistance  5: Supervision    Ambulation/Gait Assistance Details  decr. initial Lt heel contact noted on 1st rep  Ambulation Distance (Feet)  230 Feet    Assistive device  None    Gait Pattern  Within Functional Limits    Ambulation Surface  Level;Indoor          Balance Exercises - 07/02/19 2014      Balance Exercises: Standing   Standing Eyes Opened  Wide (BOA);Head turns;Foam/compliant surface;5 reps    Standing Eyes Closed  Wide (BOA);Head turns;Foam/compliant surface;Solid surface;5 reps   5 reps horizontal & vertical   Rockerboard  Anterior/posterior;Head turns;EO;EC;Intermittent UE support;Other reps (comment)   20 reps    Gait with Head Turns  Forward;2 reps   30' x 2     Marching  Foam/compliant surface;Solid surface;Head turns;Dynamic;Static   inside // bars   Other Standing Exercises  standing on rocker board - stepping off forward 5 reps,  then stepping off backward 5 reps     Other Standing Exercises Comments  pt performed head turns horizontal & vertical - standing on rocker board with UE support prn        PT Education - 07/02/19 2031    Education Details  balance on foam - feet apart & feet together - EO & EC    Person(s) Educated  Patient    Methods  Explanation;Demonstration;Handout    Comprehension  Verbalized understanding;Returned demonstration       PT Short Term Goals - 07/02/19 2039      PT SHORT TERM GOAL #1   Title  Pt will subjectively report at least 25% improvement in vertigo.    Time  4    Period  Weeks    Status  New    Target Date  07/12/19      PT SHORT TERM GOAL #2   Title  Improve DHI from 46% to </= 30% for improvement in vertigo for improved quality of life.    Baseline  46%    Time  4    Period  Weeks    Status  New    Target Date  07/12/19      PT SHORT TERM GOAL #3   Title  Complete SOT and establish goal as appropriate.    Time  4    Period  Weeks    Status  New    Target Date  07/12/19      PT SHORT TERM GOAL #4   Title  Independent in HEP for balance and vestibular exs.    Time  4    Period  Weeks    Status  New    Target Date  07/12/19        PT Long Term Goals - 07/02/19 2039      PT LONG TERM GOAL #1   Title  Improve DHI from 46% to </= 24% for improved vertigo for improved quality of life.    Baseline  46% on 06-10-19    Time  8    Period  Weeks    Status  New      PT LONG TERM GOAL #2   Title  Improve composite SOT score by at least 10 points to demo improved balance and vestibular input in maintaining balance.    Time  8    Period  Weeks    Status  New      PT LONG TERM GOAL #3   Title  Amb. 48' with horizontal head turns with SPC without LOB for improved safety with  environmental scanning.    Time  8  Period  Weeks    Status  New      PT LONG TERM GOAL #4   Title  Independent in updated HEP for balance & vestibular exercises.    Time  8    Period  Weeks    Status  New            Plan - 07/02/19 2033    Clinical Impression Statement  Pt reports increased dizziness with standing on compliant surface with head turns and with eyes closed, indicative of vestibular hypofunction.  Dizziness appears to be multi-factorial in etiology.    Personal Factors and Comorbidities  Past/Current Experience;Comorbidity 2;Time since onset of injury/illness/exacerbation    Examination-Activity Limitations  Locomotion Level;Transfers;Bend;Stand;Stairs;Squat;Reach Overhead    Examination-Participation Restrictions  Meal Prep;Cleaning;Community Activity;Driving;Volunteer;Laundry;Shop    Stability/Clinical Decision Making  Evolving/Moderate complexity    Rehab Potential  Good    PT Frequency  1x / week    PT Duration  8 weeks    PT Treatment/Interventions  ADLs/Self Care Home Management;Gait training;DME Instruction;Neuromuscular re-education;Balance training;Therapeutic exercise;Therapeutic activities;Patient/family education;Vestibular    PT Next Visit Plan  cont. gait/vestibular exercises    PT Home Exercise Plan  x1 viewing    Consulted and Agree with Plan of Care  Patient       Patient will benefit from skilled therapeutic intervention in order to improve the following deficits and impairments:  Difficulty walking, Dizziness, Decreased balance, Pain  Visit Diagnosis: Dizziness and giddiness  Unsteadiness on feet     Problem List Patient Active Problem List   Diagnosis Date Noted  . Tremor observed on examination 04/25/2019  . Multiple falls 04/25/2019  . 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 Orthostatic hypotension dysautonomic syndrome ([facility]) 04/25/2019  . Auditory vertigo 04/25/2019  . Post-traumatic headache, not  intractable 04/25/2019  . Essential hypertension 11/23/2018  . Hypothyroidism 08/21/2017  . Hyperlipidemia 08/21/2017  . Controlled type 2 diabetes mellitus with diabetic nephropathy, without long-term current use of insulin ([facility]) 08/21/2017  . Syncope 08/21/2017  . 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 Cervical spinal stenosis 12/13/2011    Kary Kos, PT 07/02/2019, 8:46 PM  [facility] [address] [place], [place], [address] Phone: [phone]   Fax:  [phone]  Name: Jessica [name] MRN: [id number] Date of Birth: 02/08/1941

## 2019-07-02 NOTE — Patient Instructions (Signed)
Feet Apart (Compliant Surface) Head Motion - Eyes Closed    Stand on compliant surface: __pillow______ with feet shoulder width apart. Close eyes and move head slowly, up and down. Repeat __1__ times per session. Do __1__ sessions per day.   (SHEET WITH 4 POSITIONS)

## 2019-07-08 ENCOUNTER — Other Ambulatory Visit: Payer: Self-pay

## 2019-07-08 ENCOUNTER — Encounter: Payer: Self-pay | Admitting: Physical Therapy

## 2019-07-08 ENCOUNTER — Ambulatory Visit: Payer: Medicare PPO | Admitting: Physical Therapy

## 2019-07-08 DIAGNOSIS — R42 Dizziness and giddiness: Secondary | ICD-10-CM

## 2019-07-08 DIAGNOSIS — R2689 Other abnormalities of gait and mobility: Secondary | ICD-10-CM | POA: Diagnosis not present

## 2019-07-08 DIAGNOSIS — R2681 Unsteadiness on feet: Secondary | ICD-10-CM

## 2019-07-08 NOTE — Therapy (Signed)
[facility] [address] [place], [place], [address] Phone: [phone]   Fax:  [phone]  Physical Therapy Treatment  Patient Details  Name: Jessica [name] MRN: [id number] Date of Birth: 01-03-1941 Referring Provider (PT): Gaye Alken, NP   Encounter Date: 07/08/2019  PT End of Session - 07/08/19 1051    Visit Number  5    Number of Visits  9    Date for PT Re-Evaluation  08/16/19    Authorization Type  06-10-19 - 09-07-19    Authorization Time Period  [employer]    PT Start Time  6962    PT Stop Time  0844    PT Time Calculation (min)  46 min    Activity Tolerance  Patient tolerated treatment well    Behavior During Therapy  [facility] for tasks assessed/performed       Past Medical History:  Diagnosis Date  . Arthritis    oa  . Auditory vertigo 04/25/2019  . Cervical spinal stenosis 12/13/2011  . Controlled type 2 diabetes mellitus with diabetic nephropathy, without long-term current use of insulin ([facility]) 08/21/2017  . Dizziness    related seeing vestibular md for inner ear  . Essential hypertension 11/23/2018  . Hyperlipidemia 08/21/2017  . Hypothyroidism   . Multiple falls 04/25/2019  . Orthostatic hypotension dysautonomic syndrome ([facility]) 04/25/2019  . Post-traumatic headache, not intractable 04/25/2019  . 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 Syncope 08/21/2017  . Tremor observed on examination 04/25/2019    Past Surgical History:  Procedure Laterality Date  . ANTERIOR CERVICAL DECOMP/DISCECTOMY FUSION  12/13/2011   Procedure: ANTERIOR CERVICAL DECOMPRESSION/DISCECTOMY FUSION 3 LEVELS;  Surgeon: Temple Pacini, MD;  Location: [facility];  Service: Neurosurgery;  Laterality: N/A;  Cervical Five-Six Cervical Six-Seven Cervical Seven-Thoracic One Anterior cervical decompression/diskectomy/fusion/plating/allograft  . BREAST BIOPSY Right 2000  . CARPAL TUNNEL RELEASE      right  . EYE SURGERY     cataract bilateral  . FOOT ARTHRODESIS Left 06/25/2015   Procedure: HALLUX INTERPHALANGEAL JOINT  ARTHRODESIS;  Surgeon: Toni Arthurs, MD;  Location: [facility];  Service: Orthopedics;  Laterality: Left;  Marland Kitchen GASTROCNEMIUS RECESSION Left 06/25/2015   Procedure: LEFT GASTROC RECESSION ;  Surgeon: Toni Arthurs, MD;  Location: [facility];  Service: Orthopedics;  Laterality: Left;  . HIP FRACTURE SURGERY Left   . TENDON REPAIR Left 06/25/2015   Procedure: LEFT ANTERIOR TIBIAL TENDON RECONSTRUCTION;  EXTENSOR HALLUCIS LONGUS TRANSFER ;  Surgeon: Toni Arthurs, MD;  Location: [facility];  Service: Orthopedics;  Laterality: Left;  . TOTAL KNEE ARTHROPLASTY Right 01/25/2016   Procedure: RIGHT TOTAL KNEE ARTHROPLASTY;  Surgeon: Durene Romans, MD;  Location: [facility];  Service: Orthopedics;  Laterality: Right;  . TUBAL LIGATION      There were no vitals filed for this visit.  Subjective Assessment - 07/08/19 0757    Subjective  Pt states she has only had 2 spinning episodes during the month of May to this date - pt states she had a good week with no episodes of spinning vertigo within past week.  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  Pt states that overall she is much better.   pt states she forgot and took Meclizine yesterday am as  precaution for preventing episode of vertigo   Pertinent History  cervical fusion 2013, Lt foot arthrodesis May 2017, Lt hip fracture sx June 2019 due to fall, Lt anterior tibial tendon reconstruction May 2017, Rt TKA Dec. 2017, cervical spinal stenosis, Controlled type 2 DM, syncope, sensorineural hearing los of Lt ear, orthostatic hypotension dysautonomic syndrome    Diagnostic tests  MRI with & without contrast (March 2021) - no acute findings    Patient Stated Goals  "be reliably stable"  - pt wants to leave house but states she has dizzy spell and has "anxiety in small spaces"    Currently in Pain?  No/denies                        [facility] Adult PT Treatment/Exercise - 07/08/19 0823      Transfers   Transfers  Sit to Stand;Stand to Sit    Sit to Stand  5: Supervision;4: Min guard    Stand to Sit  5: Supervision    Number of Reps  Other reps (comment)   5 reps - EO and EC with feet on Airex pad - no UE support     Ambulation/Gait   Ambulation/Gait  Yes    Ambulation/Gait Assistance  5: Supervision    Ambulation/Gait Assistance Details  foot slap occurrred more on 1st lap (115') but decreased in occurrence with prolonged ambulation    Ambulation Distance (Feet)  350 Feet    Assistive device  None   pt carried SPC but did not use   Gait Pattern  Within Functional Limits    Ambulation Surface  Level;Indoor          Balance Exercises - 07/08/19 1046      Balance Exercises: Standing   Standing Eyes Opened  Narrow base of support (BOS);Wide (BOA);Head turns;Foam/compliant surface;5 reps    Standing Eyes Closed  Narrow base of support (BOS);Wide (BOA);Head turns;Foam/compliant surface;5 reps    Rockerboard  Anterior/posterior;EO;EC;10 reps;Intermittent UE support   10 reps EO and then with EC   Partial Tandem Stance  Eyes open;5 reps   head turns side to side 5 reps   Marching  Foam/compliant surface;Head turns;Static;5 reps   5 reps horizontal & vertical   Other Standing Exercises  pt performed sit to stand with step and pivot turn to Rt side and then to Lt side 5 reps to each side with CGA;      Other Standing Exercises Comments  pt performed head turns with EO and then with EC horizontal & vertical - standing on rocker board with UE support prn      Pt performed stepping forward off rocker board 5 reps each leg, then stepping off backwards of board with minimal UE support    PT Education - 07/08/19 1049    Education Details  added head turns  horizontal & vertical to marching on FLOOR for HEP; partial tandem stance - head turns with UE support at counter; instructed pt to perform standing with EC on pillow first prior to doing exs. with EO to minimize fatigue    Person(s) Educated  Patient    Methods  Explanation    Comprehension  Verbalized understanding;Returned demonstration       PT Short Term Goals - 07/08/19 1057      PT SHORT TERM GOAL #1   Title  Pt will subjectively report at least 25% improvement in vertigo.    Time  4  Period  Weeks    Status  New    Target Date  07/12/19      PT SHORT TERM GOAL #2   Title  Improve DHI from 46% to </= 30% for improvement in vertigo for improved quality of life.    Baseline  46%    Time  4    Period  Weeks    Status  New    Target Date  07/12/19      PT SHORT TERM GOAL #3   Title  Complete SOT and establish goal as appropriate.    Time  4    Period  Weeks    Status  New    Target Date  07/12/19      PT SHORT TERM GOAL #4   Title  Independent in HEP for balance and vestibular exs.    Time  4    Period  Weeks    Status  New    Target Date  07/12/19        PT Long Term Goals - 07/08/19 1057      PT LONG TERM GOAL #1   Title  Improve DHI from 46% to </= 24% for improved vertigo for improved quality of life.  ALL LTG's due 08-12-19    Baseline  46% on 06-10-19    Time  8    Period  Weeks    Status  New    Target Date  08/12/19      PT LONG TERM GOAL #2   Title  Improve composite SOT score by at least 10 points to demo improved balance and vestibular input in maintaining balance.    Time  8    Period  Weeks    Status  New      PT LONG TERM GOAL #3   Title  Amb. 1' with horizontal head turns with SPC without LOB for improved safety with environmental scanning.    Time  8    Period  Weeks    Status  New      PT LONG TERM GOAL #4   Title  Independent in updated HEP for balance & vestibular exercises.    Time  8    Period  Weeks    Status  New             Plan - 07/08/19 1052    Clinical Impression Statement  Pt reported increased dizziness with standing and pivot turns to each side and also with standing on compliant surface and tracking hands moving in circle for improved VOR function/gaze stabilization.  Pt demonstrating improved balance - carried SPC during gait training but did not use with decreased Lt foot slap noted with increased ambulation distance.  Pt had minimal LOB posteriorly with sit to stand from mat with feet on compliant surface. Pt is progressing with improvements in balance and dizziness noted.    Personal Factors and Comorbidities  Past/Current Experience;Comorbidity 2;Time since onset of injury/illness/exacerbation    Examination-Activity Limitations  Locomotion Level;Transfers;Bend;Stand;Stairs;Squat;Reach Overhead    Examination-Participation Restrictions  Meal Prep;Cleaning;Community Activity;Driving;Volunteer;Laundry;Shop    Stability/Clinical Decision Making  Evolving/Moderate complexity    Rehab Potential  Good    PT Frequency  1x / week    PT Duration  8 weeks    PT Treatment/Interventions  ADLs/Self Care Home Management;Gait training;DME Instruction;Neuromuscular re-education;Balance training;Therapeutic exercise;Therapeutic activities;Patient/family education;Vestibular    PT Next Visit Plan  Check STG's - cont. gait/vestibular exercises    PT Home Exercise Plan  x1 viewing; added marching on floor with head turns; partial tandem stance with head turns    Consulted and Agree with Plan of Care  Patient       Patient will benefit from skilled therapeutic intervention in order to improve the following deficits and impairments:  Difficulty walking, Dizziness, Decreased balance, Pain  Visit Diagnosis: Dizziness and giddiness  Unsteadiness on feet  Other abnormalities of gait and mobility     Problem List Patient Active Problem List   Diagnosis Date Noted  . Tremor observed on examination  04/25/2019  . Multiple falls 04/25/2019  . 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 Orthostatic hypotension dysautonomic syndrome ([facility]) 04/25/2019  . Auditory vertigo 04/25/2019  . Post-traumatic headache, not intractable 04/25/2019  . Essential hypertension 11/23/2018  . Hypothyroidism 08/21/2017  . Hyperlipidemia 08/21/2017  . Controlled type 2 diabetes mellitus with diabetic nephropathy, without long-term current use of insulin ([facility]) 08/21/2017  . Syncope 08/21/2017  . 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 Cervical spinal stenosis 12/13/2011    Kary Kos, PT 07/08/2019, 10:59 AM  [facility] [address] [place], [place], [address] Phone: [phone]   Fax:  [phone]  Name: Jessica [name] MRN: [id number] Date of Birth: Feb 26, 1940

## 2019-07-16 ENCOUNTER — Ambulatory Visit: Payer: Medicare PPO | Admitting: Physical Therapy

## 2019-07-16 ENCOUNTER — Other Ambulatory Visit: Payer: Self-pay

## 2019-07-16 VITALS — BP 136/77

## 2019-07-16 DIAGNOSIS — R2689 Other abnormalities of gait and mobility: Secondary | ICD-10-CM | POA: Diagnosis not present

## 2019-07-16 DIAGNOSIS — R2681 Unsteadiness on feet: Secondary | ICD-10-CM | POA: Diagnosis not present

## 2019-07-16 DIAGNOSIS — R42 Dizziness and giddiness: Secondary | ICD-10-CM

## 2019-07-17 ENCOUNTER — Encounter: Payer: Self-pay | Admitting: Physical Therapy

## 2019-07-17 NOTE — Therapy (Signed)
[facility] [address] [place], [place], [address] Phone: [phone]   Fax:  [phone]  Physical Therapy Treatment  Patient Details  Name: Jessica [name] MRN: [id number] Date of Birth: August 08, 1940 Referring Provider (PT): Laverna Peace, NP   Encounter Date: 07/16/2019  PT End of Session - 07/17/19 1606    Visit Number  6    Number of Visits  9    Date for PT Re-Evaluation  08/16/19    Authorization Type  06-10-19 - 09-07-19    Authorization Time Period  [employer]    PT Start Time  0715    PT Stop Time  0800    PT Time Calculation (min)  45 min    Equipment Utilized During Treatment  Gait belt    Activity Tolerance  Patient tolerated treatment well    Behavior During Therapy  [facility] for tasks assessed/performed       Past Medical History:  Diagnosis Date  . Arthritis    oa  . Auditory vertigo 04/25/2019  . Cervical spinal stenosis 12/13/2011  . Controlled type 2 diabetes mellitus with diabetic nephropathy, without long-term current use of insulin ([place]) 08/21/2017  . Dizziness    related seeing vestibular md for inner ear  . Essential hypertension 11/23/2018  . Hyperlipidemia 08/21/2017  . Hypothyroidism   . Multiple falls 04/25/2019  . Orthostatic hypotension dysautonomic syndrome ([place]) 04/25/2019  . Post-traumatic headache, not intractable 04/25/2019  . 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 Syncope 08/21/2017  . Tremor observed on examination 04/25/2019    Past Surgical History:  Procedure Laterality Date  . ANTERIOR CERVICAL DECOMP/DISCECTOMY FUSION  12/13/2011   Procedure: ANTERIOR CERVICAL DECOMPRESSION/DISCECTOMY FUSION 3 LEVELS;  Surgeon: Charlie Pitter, MD;  Location: [place];  Service: Neurosurgery;  Laterality: N/A;  Cervical Five-Six Cervical Six-Seven Cervical Seven-Thoracic One Anterior cervical decompression/diskectomy/fusion/plating/allograft  . BREAST  BIOPSY Right 2000  . CARPAL TUNNEL RELEASE     right  . EYE SURGERY     cataract bilateral  . FOOT ARTHRODESIS Left 06/25/2015   Procedure: HALLUX INTERPHALANGEAL JOINT  ARTHRODESIS;  Surgeon: Wylene Simmer, MD;  Location: [facility];  Service: Orthopedics;  Laterality: Left;  Marland Kitchen GASTROCNEMIUS RECESSION Left 06/25/2015   Procedure: LEFT GASTROC RECESSION ;  Surgeon: Wylene Simmer, MD;  Location: [facility];  Service: Orthopedics;  Laterality: Left;  . HIP FRACTURE SURGERY Left   . TENDON REPAIR Left 06/25/2015   Procedure: LEFT ANTERIOR TIBIAL TENDON RECONSTRUCTION;  EXTENSOR HALLUCIS LONGUS TRANSFER ;  Surgeon: Wylene Simmer, MD;  Location: [facility];  Service: Orthopedics;  Laterality: Left;  . TOTAL KNEE ARTHROPLASTY Right 01/25/2016   Procedure: RIGHT TOTAL KNEE ARTHROPLASTY;  Surgeon: Paralee Cancel, MD;  Location: [facility];  Service: Orthopedics;  Laterality: Right;  . TUBAL LIGATION      Vitals:   07/16/19 0725  BP: 136/77                       [facility] Adult PT Treatment/Exercise - 07/17/19 0001      Transfers   Transfers  Sit to Stand;Stand to Sit    Sit to Stand  5: Supervision    Stand to Sit  5: Supervision    Number of Reps  Other reps (comment)   3 reps feet on floor; 5 reps feet on Airex - no UE support  Ambulation/Gait   Ambulation/Gait  Yes    Ambulation/Gait Assistance  5: Supervision    Ambulation/Gait Assistance Details  >Lt foot slap noted in stance phase of gait initially - decreased as session progressed    Ambulation Distance (Feet)  350 Feet    Assistive device  None   pt carried SPC but did not use   Gait Pattern  Within Functional Limits    Ambulation Surface  Level;Indoor          Balance Exercises - 07/17/19 1600      Balance Exercises: Standing   Standing Eyes Opened  Wide (BOA);Head turns;Foam/compliant surface;5 reps;Narrow base of support (BOS)   on blue mat   Standing Eyes Closed   Narrow base of support (BOS);Wide (BOA);Head turns;Foam/compliant surface;5 reps    SLS  Eyes open;Foam/compliant surface;5 reps   stepping over seam line on mat: tap'g in front of stance LE   SLS with Vectors  Foam/compliant surface;5 reps   standing on Bosu inside // bars    Rockerboard  Anterior/posterior;EO;EC;10 reps;Intermittent UE support;Lateral   10 reps EO and then with EC   Turning  Right;Left;Other reps (comment)   4 reps each inside // bars - no UE support used   Marching  Foam/compliant surface;Head turns;Static;5 reps   5 reps horizontal & vertical   Other Standing Exercises  pt performed amb. approx. 40' tracking ball clockwise, counterclockwise, "V" and "+" patterns for improved gaze stabilization    Other Standing Exercises Comments  pt performed head turns with EO and then with EC horizontal & vertical - standing on rocker board with UE support prn          PT Short Term Goals - 07/17/19 1610      PT SHORT TERM GOAL #1   Title  Pt will subjectively report at least 25% improvement in vertigo.    Baseline  met 07-15-19    Time  4    Period  Weeks    Status  Achieved    Target Date  07/12/19      PT SHORT TERM GOAL #2   Title  Improve DHI from 46% to </= 30% for improvement in vertigo for improved quality of life.    Baseline  46%    Time  4    Period  Weeks    Status  New    Target Date  07/12/19      PT SHORT TERM GOAL #3   Title  Complete SOT and establish goal as appropriate.    Time  4    Period  Weeks    Status  Achieved    Target Date  07/12/19      PT SHORT TERM GOAL #4   Title  Independent in HEP for balance and vestibular exs.    Baseline  met 07-15-19    Time  4    Period  Weeks    Status  Achieved    Target Date  07/12/19        PT Long Term Goals - 07/17/19 1609      PT LONG TERM GOAL #1   Title  Improve DHI from 46% to </= 24% for improved vertigo for improved quality of life.  ALL LTG's due 08-12-19    Baseline  46% on 06-10-19     Time  8    Period  Weeks    Status  New      PT LONG TERM GOAL #2  Title  Improve composite SOT score by at least 10 points to demo improved balance and vestibular input in maintaining balance.    Time  8    Period  Weeks    Status  New      PT LONG TERM GOAL #3   Title  Amb. 9' with horizontal head turns with SPC without LOB for improved safety with environmental scanning.    Time  8    Period  Weeks    Status  New      PT LONG TERM GOAL #4   Title  Independent in updated HEP for balance & vestibular exercises.    Time  8    Period  Weeks    Status  New            Plan - 07/17/19 1606    Clinical Impression Statement  Pt has met STG's #1, 3 and 4 - STG #2 not checked today due to time constraint  Pt continues to have mild LOB and c/o moderate dizziness with exercises standing on compliant surface with EC, indicative of vestibular hypofunction.    Personal Factors and Comorbidities  Past/Current Experience;Comorbidity 2;Time since onset of injury/illness/exacerbation    Examination-Activity Limitations  Locomotion Level;Transfers;Bend;Stand;Stairs;Squat;Reach Overhead    Examination-Participation Restrictions  Meal Prep;Cleaning;Community Activity;Driving;Volunteer;Laundry;Shop    Stability/Clinical Decision Making  Evolving/Moderate complexity    Rehab Potential  Good    PT Frequency  1x / week    PT Duration  8 weeks    PT Treatment/Interventions  ADLs/Self Care Home Management;Gait training;DME Instruction;Neuromuscular re-education;Balance training;Therapeutic exercise;Therapeutic activities;Patient/family education;Vestibular    PT Next Visit Plan  Check STG's -cont. gait/vestibular exercises    PT Home Exercise Plan  x1 viewing; added marching on floor with head turns; partial tandem stance with head turns    Consulted and Agree with Plan of Care  Patient       Patient will benefit from skilled therapeutic intervention in order to improve the following deficits  and impairments:  Difficulty walking, Dizziness, Decreased balance, Pain  Visit Diagnosis: Unsteadiness on feet  Dizziness and giddiness  Other abnormalities of gait and mobility     Problem List Patient Active Problem List   Diagnosis Date Noted  . Tremor observed on examination 04/25/2019  . Multiple falls 04/25/2019  . 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 Orthostatic hypotension dysautonomic syndrome ([place]) 04/25/2019  . Auditory vertigo 04/25/2019  . Post-traumatic headache, not intractable 04/25/2019  . Essential hypertension 11/23/2018  . Hypothyroidism 08/21/2017  . Hyperlipidemia 08/21/2017  . Controlled type 2 diabetes mellitus with diabetic nephropathy, without long-term current use of insulin ([place]) 08/21/2017  . Syncope 08/21/2017  . 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 Cervical spinal stenosis 12/13/2011    Alda Lea, PT 07/17/2019, 4:17 PM  [facility] [address] [place], [place], [address] Phone: [phone]   Fax:  [phone]  Name: Jessica [name] MRN: [id number] Date of Birth: March 31, 1940

## 2019-07-23 ENCOUNTER — Other Ambulatory Visit: Payer: Self-pay

## 2019-07-23 ENCOUNTER — Ambulatory Visit: Payer: Medicare PPO | Attending: Neurology | Admitting: Physical Therapy

## 2019-07-23 DIAGNOSIS — R2681 Unsteadiness on feet: Secondary | ICD-10-CM | POA: Diagnosis not present

## 2019-07-23 DIAGNOSIS — R2689 Other abnormalities of gait and mobility: Secondary | ICD-10-CM | POA: Diagnosis not present

## 2019-07-23 DIAGNOSIS — R42 Dizziness and giddiness: Secondary | ICD-10-CM | POA: Diagnosis not present

## 2019-07-24 ENCOUNTER — Encounter: Payer: Self-pay | Admitting: Physical Therapy

## 2019-07-24 NOTE — Therapy (Signed)
[facility] [address] [place], [place], [address] Phone: [phone]   Fax:  [phone]  Physical Therapy Treatment  Patient Details  Name: Jessica [name] MRN: [id number] Date of Birth: 1940-04-24 Referring Provider (PT): Laverna Peace, NP   Encounter Date: 07/23/2019  PT End of Session - 07/24/19 1236    Visit Number  7    Number of Visits  9    Date for PT Re-Evaluation  08/16/19    Authorization Type  06-10-19 - 09-07-19    Authorization Time Period  [employer]    PT Start Time  2202    PT Stop Time  0800    PT Time Calculation (min)  44 min    Activity Tolerance  Patient tolerated treatment well    Behavior During Therapy  [facility] for tasks assessed/performed       Past Medical History:  Diagnosis Date  . Arthritis    oa  . Auditory vertigo 04/25/2019  . Cervical spinal stenosis 12/13/2011  . Controlled type 2 diabetes mellitus with diabetic nephropathy, without long-term current use of insulin ([place]) 08/21/2017  . Dizziness    related seeing vestibular md for inner ear  . Essential hypertension 11/23/2018  . Hyperlipidemia 08/21/2017  . Hypothyroidism   . Multiple falls 04/25/2019  . Orthostatic hypotension dysautonomic syndrome ([place]) 04/25/2019  . Post-traumatic headache, not intractable 04/25/2019  . 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 Syncope 08/21/2017  . Tremor observed on examination 04/25/2019    Past Surgical History:  Procedure Laterality Date  . ANTERIOR CERVICAL DECOMP/DISCECTOMY FUSION  12/13/2011   Procedure: ANTERIOR CERVICAL DECOMPRESSION/DISCECTOMY FUSION 3 LEVELS;  Surgeon: Charlie Pitter, MD;  Location: [facility];  Service: Neurosurgery;  Laterality: N/A;  Cervical Five-Six Cervical Six-Seven Cervical Seven-Thoracic One Anterior cervical decompression/diskectomy/fusion/plating/allograft  . BREAST BIOPSY Right 2000  . CARPAL TUNNEL RELEASE     right  . EYE SURGERY     cataract bilateral  . FOOT ARTHRODESIS Left 06/25/2015   Procedure: HALLUX INTERPHALANGEAL JOINT  ARTHRODESIS;  Surgeon: Wylene Simmer, MD;  Location: [facility];  Service: Orthopedics;  Laterality: Left;  Marland Kitchen GASTROCNEMIUS RECESSION Left 06/25/2015   Procedure: LEFT GASTROC RECESSION ;  Surgeon: Wylene Simmer, MD;  Location: [facility];  Service: Orthopedics;  Laterality: Left;  . HIP FRACTURE SURGERY Left   . TENDON REPAIR Left 06/25/2015   Procedure: LEFT ANTERIOR TIBIAL TENDON RECONSTRUCTION;  EXTENSOR HALLUCIS LONGUS TRANSFER ;  Surgeon: Wylene Simmer, MD;  Location: [facility];  Service: Orthopedics;  Laterality: Left;  . TOTAL KNEE ARTHROPLASTY Right 01/25/2016   Procedure: RIGHT TOTAL KNEE ARTHROPLASTY;  Surgeon: Paralee Cancel, MD;  [facility];  Service: Orthopedics;  Laterality: Right;  . TUBAL LIGATION      There were no vitals filed for this visit.  Subjective Assessment - 07/23/19 0719    Subjective  Pt states she had a "big spin" yesterday - lasted approx. 5"-6"- was really fast spinning; happens spontaneously for no known reason   pt states she forgot and took Meclizine yesterday am as precaution for preventing episode of vertigo   Pertinent History  cervical fusion 2013, Lt foot arthrodesis May 2017, Lt hip fracture sx June 2019 due to fall, Lt anterior tibial tendon reconstruction May 2017, Rt TKA Dec. 2017, cervical spinal stenosis, Controlled type 2 DM, syncope, sensorineural hearing los of Lt ear, orthostatic hypotension  dysautonomic syndrome    Diagnostic tests  MRI with & without contrast (March 2021) - no acute findings    Patient Stated Goals  "be reliably stable" - pt wants to leave house but states she has dizzy spell and has "anxiety in small spaces"    Currently in Pain?  Other (Comment)   no pain just stiffness around base of neck                       [facility] Adult PT Treatment/Exercise  - 07/24/19 0001      Transfers   Transfers  Sit to Stand;Stand to Sit    Sit to Stand  5: Supervision    Stand to Sit  5: Supervision    Number of Reps  Other reps (comment)   5 reps   Comments  feet on Airex      Ambulation/Gait   Ambulation/Gait  Yes    Ambulation/Gait Assistance  5: Supervision    Ambulation/Gait Assistance Details  >Lt foot slap on initial lap compared to amb. on 2nd, 3rd laps    Ambulation Distance (Feet)  350 Feet    Assistive device  None   pt carried [facility] but did not use   Gait Pattern  Within Functional Limits    Ambulation Surface  Level;Indoor          Balance Exercises - 07/24/19 1227      Balance Exercises: Standing   Standing Eyes Opened  Wide (BOA);Head turns;Foam/compliant surface;5 reps;Narrow base of support (BOS)   on blue mat   Standing Eyes Closed  Narrow base of support (BOS);Wide (BOA);Head turns;Foam/compliant surface;5 reps    Rockerboard  Anterior/posterior;EO;EC;10 reps;Intermittent UE support;Lateral   10 reps EO and then with EC   Turning  Right;Left   2 reps each direction   Marching  Foam/compliant surface;Head turns;Static;10 reps    Other Standing Exercises  pt performed amb. approx. 40' tracking ball clockwise, counterclockwise, "V" and "+" patterns for improved gaze stabilization    Other Standing Exercises Comments  pt performed SLS activity on floor - rolling ball forward/back and laterally           PT Short Term Goals - 07/23/19 0729      PT SHORT TERM GOAL #1   Title  Pt will subjectively report at least 25% improvement in vertigo.    Baseline  met 07-15-19    Time  4    Period  Weeks    Status  Achieved    Target Date  07/12/19      PT SHORT TERM GOAL #2   Title  Improve DHI from 46% to </= 30% for improvement in vertigo for improved quality of life.    Baseline  46%    Time  4    Period  Weeks    Status  New    Target Date  07/12/19      PT SHORT TERM GOAL #3   Title  Complete SOT and establish  goal as appropriate.    Time  4    Period  Weeks    Status  Achieved    Target Date  07/12/19      PT SHORT TERM GOAL #4   Title  Independent in HEP for balance and vestibular exs.    Baseline  met 07-15-19    Time  4    Period  Weeks    Status  Achieved    Target Date  07/12/19        PT Long Term Goals - 07/24/19 1249      PT LONG TERM GOAL #1   Title  Improve DHI from 46% to </= 24% for improved vertigo for improved quality of life.  ALL LTG's due 08-12-19    Baseline  46% on 06-10-19    Time  8    Period  Weeks    Status  New      PT LONG TERM GOAL #2   Title  Improve composite SOT score by at least 10 points to demo improved balance and vestibular input in maintaining balance.    Time  8    Period  Weeks    Status  New      PT LONG TERM GOAL #3   Title  Amb. 39' with horizontal head turns with SPC without LOB for improved safety with environmental scanning.    Time  8    Period  Weeks    Status  New      PT LONG TERM GOAL #4   Title  Independent in updated HEP for balance & vestibular exercises.    Time  8    Period  Weeks    Status  New            Plan - 07/24/19 1236    Clinical Impression Statement  Pt is improving with balance and vestibular activities; pt reports most dizziness with standing on compliant surfaces with EC and also with EC with head turns, indicative of vestibular hypofunction.  Pt cont to c/o cervical musc. tightness which she feels increases internally when episode of dizziness does occur - pt continues to question if vertigo is cervicogenic in nature.  Pt does demonstrate improvement with episodes of dizziness much less frequent.    Personal Factors and Comorbidities  Past/Current Experience;Comorbidity 2;Time since onset of injury/illness/exacerbation    Examination-Activity Limitations  Locomotion Level;Transfers;Bend;Stand;Stairs;Squat;Reach Overhead    Examination-Participation Restrictions  Meal Prep;Cleaning;Community  Activity;Driving;Volunteer;Laundry;Shop    Stability/Clinical Decision Making  Evolving/Moderate complexity    Rehab Potential  Good    PT Frequency  1x / week    PT Duration  8 weeks    PT Treatment/Interventions  ADLs/Self Care Home Management;Gait training;DME Instruction;Neuromuscular re-education;Balance training;Therapeutic exercise;Therapeutic activities;Patient/family education;Vestibular    PT Next Visit Plan  pt to follow up with Dr. Brett Fairy on 07-29-19 - pt to either continue or D/C PT based on her recommendation    PT Home Exercise Plan  x1 viewing; added marching on floor with head turns; partial tandem stance with head turns    Consulted and Agree with Plan of Care  Patient       Patient will benefit from skilled therapeutic intervention in order to improve the following deficits and impairments:  Difficulty walking, Dizziness, Decreased balance, Pain  Visit Diagnosis: Unsteadiness on feet  Dizziness and giddiness  Other abnormalities of gait and mobility     Problem List Patient Active Problem List   Diagnosis Date Noted  . Tremor observed on examination 04/25/2019  . Multiple falls 04/25/2019  . 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 Orthostatic hypotension dysautonomic syndrome ([place]) 04/25/2019  . Auditory vertigo 04/25/2019  . Post-traumatic headache, not intractable 04/25/2019  . Essential hypertension 11/23/2018  . Hypothyroidism 08/21/2017  . Hyperlipidemia 08/21/2017  . Controlled type 2 diabetes mellitus with diabetic nephropathy, without long-term current use of insulin ([place]) 08/21/2017  . Syncope 08/21/2017  . S/P knee replacement  01/25/2016  . Cervical spinal stenosis 12/13/2011    Alda Lea, PT 07/24/2019, 12:51 PM  [facility] [address] [place], [place], [address] Phone: [phone]   Fax:  [phone]  Name: DILANA MCPHIE MRN: [id number] Date  of Birth: 1940/11/25

## 2019-07-29 ENCOUNTER — Ambulatory Visit: Payer: Medicare PPO | Admitting: Neurology

## 2019-07-29 ENCOUNTER — Other Ambulatory Visit: Payer: Self-pay

## 2019-07-29 ENCOUNTER — Encounter: Payer: Self-pay | Admitting: Neurology

## 2019-07-29 VITALS — BP 149/78 | HR 76 | Ht 68.0 in | Wt 176.0 lb

## 2019-07-29 DIAGNOSIS — G903 Multi-system degeneration of the autonomic nervous system: Secondary | ICD-10-CM

## 2019-07-29 DIAGNOSIS — R296 Repeated falls: Secondary | ICD-10-CM | POA: Diagnosis not present

## 2019-07-29 DIAGNOSIS — H81313 Aural vertigo, bilateral: Secondary | ICD-10-CM | POA: Diagnosis not present

## 2019-07-29 DIAGNOSIS — I951 Orthostatic hypotension: Secondary | ICD-10-CM

## 2019-07-29 DIAGNOSIS — G44309 Post-traumatic headache, unspecified, not intractable: Secondary | ICD-10-CM | POA: Diagnosis not present

## 2019-07-29 DIAGNOSIS — R251 Tremor, unspecified: Secondary | ICD-10-CM

## 2019-07-29 DIAGNOSIS — M542 Cervicalgia: Secondary | ICD-10-CM

## 2019-07-29 HISTORY — DX: Cervicalgia: M54.2

## 2019-07-29 NOTE — Progress Notes (Signed)
SLEEP MED[facility]INE CL[facility]IC    Provider:  Melvyn Novasarmen  Nekisha Mcdiarmid, MD  Primary Care Physician:  Hurshel PartyMoon, Amy A, NP [address] [place] [place] [id number]    Referring Provider: Hurshel PartyMoon, Amy A, Np [address] [place] [place] [id number]          Chief Complaint according to patient   Patient presents with:    . New Patient (Initial Visit)           HISTORY OF PRESENT ILLNESS:      Eddie NorthJoan M Dearinger is a 79 year old Caucasian female patient seen here upon  a referral on 07/29/2019 from cardiology and PCP.  she has undergone cardiac work up- reportedly her cardiologist was content with her results, and sees  in vestibular rehab.  The patient kept a record of her vertigo spells in 2017 she had to, in 2018 for, none in 2019.  2020 she had 2 vertigo episodes but in 2021 between January and March 3, there were 23 vertigo episodes April 5, May 2021 and none in June. MRI brain was normal, metabolical blood work returned  all normal.  Dr. Dutch QuintPoole has not seen her for her neck.  She has rigidity in the neck , head feels  Very heavy- some discomfort and interferes with daily mobility. Rosalita ChessmanSuzanne PT, [facility] PT has helped greatly. She has still dizziness but not a true vertigo now.       I have the pleasure of seeing Eddie NorthJoan M Bolz  On 04-25-2019, a right -handed White or Caucasian female with a possible vertigo disorder.  She has a  has a past medical history of Arthritis, Dizziness/ Vertigo with spinning sensation ,  Syncope , and Hypothyroidism.Marland Kitchen.  She was considered borderline DM and changed that with diet.  Today her spinning sensation occurred while on the commode to urinate.  She has been hydrating well, 3 liters a day.  She has orthostatic problems. She has a PT that come to the home, had dry needling for neck ROM.  Had several orthopedic surgeries.     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 She  has 2 stepsons. Pets are present, a dog.Tobacco use: never .  ETOH use moderately in the past, none for years , Caffeine intake in form of Coffee( 1 cup in AM ) Soda( rare ) Tea ( none) or energy drinks..   Hobbies : reading.  Sleep habits are as follows: The patient's dinner time is between 5.30- 6  PM. The patient goes to bed at 10 PM and continues to sleep for  hours, wakes for 1-2 bathroom breaks.   The preferred sleep position is right side, with the support of  pillows.  Dreams are reportedly frequent/vivid.  6.30  AM is the usual rise time. The patient wakes up spontaneously.  She reports not feeling refreshed or restored in AM.  Review of Systems: Out of a complete 14 system review, the patient complains of only the following symptoms, and all other reviewed systems are negative  Feeling in motion when not moving, spinning sensation when at worst case scenario, left foot drop,  Stiffness of gait,  Dizziness when walking, orthostatic BP changes. Lightheadedness.   left head turn is a trigger for vertigo.     Social History   Socioeconomic History  . Marital status: Widowed  Spouse name: Not on file  . Number of children: Not on file  . Years of education: Not on file  . Highest education level: Not on file  Occupational History  . Not on file  Tobacco Use  . Smoking status: Never Smoker  . Smokeless tobacco: Never Used  Substance and Sexual Activity  . Alcohol use: Not Currently    Comment: rarely  . Drug use: No  . Sexual activity: Never  Other Topics Concern  . Not on file  Social History Narrative  . Not on file   Social Determinants of Health   Financial Resource Strain:   . Difficulty of Paying Living Expenses:   Food Insecurity:   . Worried About Charity fundraiser in the Last Year:   . Arboriculturist in the Last Year:   Transportation Needs:   . Film/video editor (Medical):   Marland Kitchen Lack of Transportation (Non-Medical):   Physical Activity:   .  Days of Exercise per Week:   . Minutes of Exercise per Session:   Stress:   . Feeling of Stress :   Social Connections:   . Frequency of Communication with Friends and Family:   . Frequency of Social Gatherings with Friends and Family:   . Attends Religious Services:   . Active Member of Clubs or Organizations:   . Attends Archivist Meetings:   Marland Kitchen Marital Status:     Family History  Problem Relation Age of Onset  . Dementia Mother   . Osteoporosis Mother   . Other Father        Senescence  . Prostate cancer Maternal Grandfather     Past Medical History:  Diagnosis Date  . Arthritis    oa  . Auditory vertigo 04/25/2019  . Cervical spinal stenosis 12/13/2011  . Controlled type 2 diabetes mellitus with diabetic nephropathy, without long-term current use of insulin ([place]) 08/21/2017  . Dizziness    related seeing vestibular md for inner ear  . Essential hypertension 11/23/2018  . Hyperlipidemia 08/21/2017  . Hypothyroidism   . Multiple falls 04/25/2019  . Orthostatic hypotension dysautonomic syndrome ([place]) 04/25/2019  . Post-traumatic headache, not intractable 04/25/2019  . 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 Syncope 08/21/2017  . Tremor observed on examination 04/25/2019    Past Surgical History:  Procedure Laterality Date  . ANTERIOR CERVICAL DECOMP/DISCECTOMY FUSION  12/13/2011   Procedure: ANTERIOR CERVICAL DECOMPRESSION/DISCECTOMY FUSION 3 LEVELS;  Surgeon: Charlie Pitter, MD;  Location: [facility];  Service: Neurosurgery;  Laterality: N/A;  Cervical Five-Six Cervical Six-Seven Cervical Seven-Thoracic One Anterior cervical decompression/diskectomy/fusion/plating/allograft  . BREAST BIOPSY Right 2000  . CARPAL TUNNEL RELEASE     right  . EYE SURGERY     cataract bilateral  . FOOT ARTHRODESIS Left 06/25/2015   Procedure: HALLUX INTERPHALANGEAL JOINT  ARTHRODESIS;  Surgeon: Wylene Simmer, MD;  Location: [facility];  Service: Orthopedics;  Laterality: Left;  Marland Kitchen GASTROCNEMIUS RECESSION Left 06/25/2015   Procedure: LEFT GASTROC RECESSION ;  Surgeon: Wylene Simmer, MD;  Location: [facility];  Service: Orthopedics;  Laterality: Left;  . HIP FRACTURE SURGERY Left   . TENDON REPAIR Left 06/25/2015   Procedure: LEFT ANTERIOR TIBIAL TENDON RECONSTRUCTION;  EXTENSOR HALLUCIS LONGUS TRANSFER ;  Surgeon: Wylene Simmer, MD;  Location: [facility];  Service: Orthopedics;  Laterality: Left;  . TOTAL KNEE ARTHROPLASTY Right 01/25/2016  Procedure: RIGHT TOTAL KNEE ARTHROPLASTY;  Surgeon: Durene Romans, MD;  Location: [facility];  Service: Orthopedics;  Laterality: Right;  . TUBAL LIGATION       Current Outpatient Medications on File Prior to Visit  Medication Sig Dispense Refill  . Calcium Carb-Cholecalciferol (CALCIUM 500+D PO) Take 1 tablet by mouth daily.    . citalopram (CELEXA) 10 MG tablet Take 10 mg by mouth at bedtime.    . ergocalciferol (VITAMIN D2) 50000 UNITS capsule Take 50,000 Units by mouth once a week. Due on 10/15     . FORTEO 600 MCG/2.4ML SOPN ADM 0.08 ML Shirley D    . levothyroxine (SYNTHROID) 100 MCG tablet TK 1 T PO D    . meclizine (ANTIVERT) 25 MG tablet Take 25 mg by mouth as needed for dizziness.    . metoprolol succinate (TOPROL-XL) 25 MG 24 hr tablet TAKE 1 TABLET(25 MG) BY MOUTH DAILY 90 tablet 1  . Multiple Vitamin (MULTIVITAMIN WITH MINERALS) TABS tablet Take 7 tablets by mouth daily. [place] Multivitamin regimen    . rosuvastatin (CRESTOR) 5 MG tablet TK 1 T PO 2 TIMES A WK     No current facility-administered medications on file prior to visit.    No Known Allergies  Physical exam:  Today's Vitals   07/29/19 0928 07/29/19 0937 07/29/19 0938  BP: (!) 158/74 (!) 166/82 (!) 149/78  Pulse: 63 66 76  Weight: 176 lb (79.8 kg)    Height: 5\' 8"  (1.727 m)     Body mass index is 26.76 kg/m.   Wt Readings from Last 3 Encounters:  07/29/19 176 lb (79.8 kg)    06/13/19 179 lb (81.2 kg)  04/25/19 178 lb (80.7 kg)     Ht Readings from Last 3 Encounters:  07/29/19 5\' 8"  (1.727 m)  06/13/19 5' 8.5" (1.74 m)  04/25/19 5\' 8"  (1.727 m)      General: The patient is awake, alert and appears not in acute distress. The patient is well groomed. Head: Normocephalic, atraumatic. Neck is supple. Mallampati 3.,  neck circumference:14.5 inches . Nasal airflow patent.  Retrognathia is not  seen.  Dental status:  dentures Cardiovascular:  irregular rate and cardiac rhythm by pulse,  without distended neck veins. Respiratory: Lungs are clear to auscultation.  Skin:  Without evidence of ankle edema, or rash. Trunk: The patient's posture is erect, not stooped.    Neurologic exam : The patient is awake and alert, oriented to place and time.   Memory subjective described as intact.  Attention span & concentration ability appears normal.  Speech is fluent,  without  dysarthria, dysphonia or aphasia.  Mood and affect are appropriate.   Cranial nerves: no loss of smell or taste reported  Pupils are equal and briskly reactive to light. Funduscopic exam ; status post cataract surgery-  Extraocular movements in vertical and horizontal planes were intact and without nystagmus. No Diplopia. Visual fields by finger perimetry are intact. Hearing was impaired on the left- uses a hearing aid.  Facial sensation intact to fine touch.  Facial motor strength is symmetric and tongue and uvula move midline.  Slight jaw tremor, no tongue tremor.  Neck ROM : rotation, tilt and flexion extension were restricted  for age and shoulder shrug was lower on the left   none in the right  Motor exam:  increased tone in the left arm, biceps, cog-wheeling. Symmetric bulk, \ ROM.   Normal tone without cog-wheeling, symmetric grip strength . Sensory:  Fine  touch, pinprick and vibration were  normal.  Proprioception tested in the upper extremities was normal. Coordination: Rapid  alternating movements in the fingers/hands were of normal speed.  The Finger-to-nose maneuver was impaired - there was  evidence of ataxia, dysmetria or tremor. There is tremor in both hands.    Gait and station: Patient could rise unassisted from a seated position, walked without assistive device.  Stance is of normal width/ base and the patient turned with 4 steps.  She walked very rigid, erect and showed a clear foot drop.  Toe and heel walk were deferred.  Deep tendon reflexes: in the  upper and lower extremities are symmetric and intact.  Babinski response was deferred.     Mrs. Harwick is a 79 year old Caucasian right-handed female and retired Runner, broadcasting/film/video who had been evaluated in the year 2019 for repeated syncopal episodes.   Her exam at that time was unremarkable and the evaluations were 30-day Holter monitor revealed no cardiac abnormalities.   Even at that time she had multiple symptoms of lightheadedness and dizziness.  She had premature atrial count tractions on her EKG and her cardiologist chose to put her on a beta-blocker to reduce these extrasystoles.  There was no chest pain.  In the meantime her gait has become more unsteady and she feels more insecure as she walks plays a certain level of hypervigilance.  We started our exam after a lengthy interview with orthostatic blood pressure measures and she shows a quite significant drop in her supine to standing blood pressure a total of 28 mmHg by her systolic blood pressure dropped her diastolic blood pressure remained stable and her heart rate varied not very greatly.  Between supine and seated position she felt dizzy and she felt as if in motion although she was not moving, for the second part changing from a seated to a standing position she felt lightheaded as well.  Her gait is significantly affected there is #1 her foot drop she walks like a person with a neuropathy that does not feel the ground she walks on there is an insecurity of where  to place her feet and her tendency is to walk the ground to see where she steps, because she does not have a reliable feedback mechanism. Has the typical electric shock sensations arising from her feet and ankles with tops of my feet and the tops of her feet tingle.    Further noted were bilateral probably essential tremor with some cogwheeling over the biceps area usually a sign of Parkinson's disease but she had a history of restricted range of motion for neck and shoulder on the left and actually had a cervical spine surgery fusion.   The vestibular organ is a possible cause because rapid changing of the head to the left can bring on a spinning sensation of vertigo.  She has this problem rarely.    Has the typical electric shock sensations arising from her feet and ankles with tops of my feet and the tops of her feet tingle. Unchanged.    #2 orthopedic reasons mainly foot drop, knee and hip replacement.     #3 orthostatic dysautonomia-today's also started with blood pressures on 29 July 2019 show a supine blood pressure of 158/74 and a heart rate of 63 regular.  Seated blood pressure of 168/82 and a heart rate of 66 regular and is standing blood pressure of 149/78 mmHg with a heart rate of 76.  This is a 20 point drop between seated  and standing position.  #4she very likely some vestibulitis.  The vestibular organ is a possible cause because rapid changing of the head to the left can bring on a spinning sensation of vertigo.  She has this problem rarely now- she reports a sharp pain if she sneezes, coughs or bears down..    #5She broke her odontoid process C 2 in a fall and has a record of 3 concussions. 2 with the last falls.  This may be the only other angle I have in evaluating her.  I would like for her to undergo an MRI of the cervical spine with and without contrast to look at the odontoid processes but also if there is a significant foraminal stenosis or spinal canal stenosis.      dysautonomic ...  Ref Range & Units 3 mo ago  Glucose 65 - 99 mg/dL 154MGQQ    BUN 8 - 27 mg/dL 20   Creatinine, Ser 7.61 - 1.00 mg/dL 9.50   GFR calc non Af Amer >59 mL/min/1.73 61   GFR calc Af Amer >59 mL/min/1.73 71   BUN/Creatinine Ratio 12 - 28 22   Sodium 134 - 144 mmol/L 135   Potassium 3.5 - 5.2 mmol/L 4.5   Chloride 96 - 106 mmol/L 96   CO2 20 - 29 mmol/L 22   Calcium 8.7 - 10.3 mg/dL 93.2IZTI    Total Protein 6.0 - 8.5 g/dL 7.8   Albumin 3.7 - 4.7 g/dL 4.7   Globulin, Total 1.5 - 4.5 g/dL 3.1   Albumin/Globulin Ratio 1.2 - 2.2 1.5   Bilirubin Total 0.0 - 1.2 mg/dL 0.3   Alkaline Phosphatase 39 - 117 IU/L 65   AST 0 - 40 IU/L 24   ALT 0 - 32 IU/L 29   Resulting Agency  [facility]        After spending a total time of  35 minutes face to face with  time for physical and neurologic examination, review of laboratory studies,  personal review of imaging studies, reports and results of other testing and review of referral information / records as far as provided in visit, I have established the following assessments   My Plan is to proceed with:  1) vertigo  improved with vestibular rehab. No further treatment- continue exercises  2) MRI brain was normal, but neck is very stiff. I will order a cervical MRI>  3) continue Pt, I do not usually endorse chiropractic treatment but I would endorse a chiropractor that would not check or apply pressure to her neck.  I think it is worse trying this out.  I would like to thank Waynetta Sandy, Amy A, NP, for allowing me to meet with and to take care of this pleasant patient.   I I plan to follow up either personally in 3 month   CC: I will share my notes with PCP and Dr Dutch Quint, Dr Roanna Raider.  Electronically signed by: Melvyn Novas, MD 07/29/2019 9:50 AM  [facility] Board certified by The [employer] of Sleep Medicine and Diplomate of the [employer] of Sleep Medicine. Board certified In  Neurology through the ABPN, Fellow of the [employer] of Neurology. Medical Director of [facility].

## 2019-07-31 ENCOUNTER — Telehealth: Payer: Self-pay | Admitting: Neurology

## 2019-07-31 NOTE — Telephone Encounter (Signed)
[employer] uploaded notes

## 2019-08-01 NOTE — Telephone Encounter (Signed)
Jessica [name]: [id number] (exp. 07/31/19 to 08/30/19) order sent to GI. They will reach out to the patient to schedule.

## 2019-08-12 ENCOUNTER — Ambulatory Visit
Admission: RE | Admit: 2019-08-12 | Discharge: 2019-08-12 | Disposition: A | Discharge status: Home / Self Care | Payer: Medicare PPO | Source: Ambulatory Visit | Attending: Neurology | Admitting: Neurology

## 2019-08-12 ENCOUNTER — Other Ambulatory Visit: Payer: Self-pay

## 2019-08-12 DIAGNOSIS — R296 Repeated falls: Secondary | ICD-10-CM

## 2019-08-12 DIAGNOSIS — R040 Epistaxis: Secondary | ICD-10-CM | POA: Diagnosis not present

## 2019-08-12 DIAGNOSIS — G44309 Post-traumatic headache, unspecified, not intractable: Secondary | ICD-10-CM

## 2019-08-12 DIAGNOSIS — I951 Orthostatic hypotension: Secondary | ICD-10-CM

## 2019-08-12 DIAGNOSIS — Z6826 Body mass index (BMI) 26.0-26.9, adult: Secondary | ICD-10-CM | POA: Diagnosis not present

## 2019-08-12 DIAGNOSIS — M542 Cervicalgia: Secondary | ICD-10-CM | POA: Diagnosis not present

## 2019-08-12 DIAGNOSIS — R251 Tremor, unspecified: Secondary | ICD-10-CM

## 2019-08-12 MED ORDER — GADOBENATE DIMEGLUMINE 529 MG/ML IV SOLN
15.0000 mL | Freq: Once | INTRAVENOUS | Status: AC | PRN
  Administered 2019-08-12: 15 mL via INTRAVENOUS

## 2019-08-15 NOTE — Progress Notes (Signed)
On axial views: C2-3: no spinal stenosis or foraminal narrowing C3-4: disc bulging and facet hypertrophy with severe biforaminal stenosis  C4-5: no spinal stenosis or foraminal narrowing C5-6: no spinal stenosis or foraminal narrowing C6-7: no spinal stenosis or foraminal narrowing  C7-T1: no spinal stenosis or foraminal narrowing  T1-2: no spinal stenosis or foraminal narrowing   Limited views of the soft tissues of the head and neck are unremarkable.  No abnormal enhancing lesions.   IMPRESSION:   MRI cervical spine with and without contrast demonstrating: - At C3-4: disc bulging and facet hypertrophy with severe biforaminal stenosis. - Anterior cervical interbody fusion at C5-C6-C7-T1. - Slight deformity of the odontoid process consistent with known prior fracture.    INTERPRETING PHYSICIAN:  Suanne Marker, MD

## 2019-08-19 ENCOUNTER — Telehealth: Payer: Self-pay | Admitting: Neurology

## 2019-08-19 DIAGNOSIS — E785 Hyperlipidemia, unspecified: Secondary | ICD-10-CM | POA: Diagnosis not present

## 2019-08-19 DIAGNOSIS — M1990 Unspecified osteoarthritis, unspecified site: Secondary | ICD-10-CM | POA: Diagnosis not present

## 2019-08-19 DIAGNOSIS — M810 Age-related osteoporosis without current pathological fracture: Secondary | ICD-10-CM | POA: Diagnosis not present

## 2019-08-19 DIAGNOSIS — E039 Hypothyroidism, unspecified: Secondary | ICD-10-CM | POA: Diagnosis not present

## 2019-08-19 DIAGNOSIS — H8113 Benign paroxysmal vertigo, bilateral: Secondary | ICD-10-CM | POA: Diagnosis not present

## 2019-08-19 DIAGNOSIS — Z6826 Body mass index (BMI) 26.0-26.9, adult: Secondary | ICD-10-CM | POA: Diagnosis not present

## 2019-08-19 DIAGNOSIS — I491 Atrial premature depolarization: Secondary | ICD-10-CM | POA: Diagnosis not present

## 2019-08-19 DIAGNOSIS — E119 Type 2 diabetes mellitus without complications: Secondary | ICD-10-CM | POA: Diagnosis not present

## 2019-08-19 DIAGNOSIS — E559 Vitamin D deficiency, unspecified: Secondary | ICD-10-CM | POA: Diagnosis not present

## 2019-08-19 NOTE — Telephone Encounter (Signed)
-----   Message from Melvyn Novas, MD sent at 08/15/2019  1:13 PM EDT ----- On axial views: C2-3: no spinal stenosis or foraminal narrowing C3-4: disc bulging and facet hypertrophy with severe biforaminal stenosis  C4-5: no spinal stenosis or foraminal narrowing C5-6: no spinal stenosis or foraminal narrowing C6-7: no spinal stenosis or foraminal narrowing  C7-T1: no spinal stenosis or foraminal narrowing  T1-2: no spinal stenosis or foraminal narrowing   Limited views of the soft tissues of the head and neck are unremarkable.  No abnormal enhancing lesions.   IMPRESSION:   MRI cervical spine with and without contrast demonstrating: - At C3-4: disc bulging and facet hypertrophy with severe biforaminal stenosis. - Anterior cervical interbody fusion at C5-C6-C7-T1. - Slight deformity of the odontoid process consistent with known prior fracture.    INTERPRETING PHYSICIAN:  Suanne Marker, MD

## 2019-08-19 NOTE — Telephone Encounter (Signed)
Called the pt and reviewed her MRI in detail with her. She does have concerns with pain and discomfort. Advised that Dr Vickey Huger doesn't feel much conservative treatment can be offered and would recommend the patient follow up with her neurosurgery MD. Pt has seen Dr Jordan Likes in the past and she states that she will contact them. Advised if the patient needed [place] for anything she could contact us back.

## 2019-08-20 ENCOUNTER — Encounter: Payer: Self-pay | Admitting: Neurology

## 2019-09-11 DIAGNOSIS — M47812 Spondylosis without myelopathy or radiculopathy, cervical region: Secondary | ICD-10-CM | POA: Diagnosis not present

## 2019-09-11 HISTORY — DX: Spondylosis without myelopathy or radiculopathy, cervical region: M47.812

## 2019-09-23 DIAGNOSIS — M542 Cervicalgia: Secondary | ICD-10-CM | POA: Diagnosis not present

## 2019-09-26 DIAGNOSIS — M542 Cervicalgia: Secondary | ICD-10-CM | POA: Diagnosis not present

## 2019-09-26 DIAGNOSIS — M6281 Muscle weakness (generalized): Secondary | ICD-10-CM | POA: Diagnosis not present

## 2019-09-30 DIAGNOSIS — M6281 Muscle weakness (generalized): Secondary | ICD-10-CM | POA: Diagnosis not present

## 2019-09-30 DIAGNOSIS — M542 Cervicalgia: Secondary | ICD-10-CM | POA: Diagnosis not present

## 2019-10-03 DIAGNOSIS — M542 Cervicalgia: Secondary | ICD-10-CM | POA: Diagnosis not present

## 2019-10-03 DIAGNOSIS — M6281 Muscle weakness (generalized): Secondary | ICD-10-CM | POA: Diagnosis not present

## 2019-10-07 DIAGNOSIS — R42 Dizziness and giddiness: Secondary | ICD-10-CM | POA: Diagnosis not present

## 2019-10-07 DIAGNOSIS — R040 Epistaxis: Secondary | ICD-10-CM | POA: Diagnosis not present

## 2019-10-07 DIAGNOSIS — M542 Cervicalgia: Secondary | ICD-10-CM | POA: Diagnosis not present

## 2019-10-07 DIAGNOSIS — M6281 Muscle weakness (generalized): Secondary | ICD-10-CM | POA: Diagnosis not present

## 2019-10-07 HISTORY — DX: Epistaxis: R04.0

## 2019-10-10 DIAGNOSIS — M542 Cervicalgia: Secondary | ICD-10-CM | POA: Diagnosis not present

## 2019-10-10 DIAGNOSIS — M6281 Muscle weakness (generalized): Secondary | ICD-10-CM | POA: Diagnosis not present

## 2019-10-14 DIAGNOSIS — M6281 Muscle weakness (generalized): Secondary | ICD-10-CM | POA: Diagnosis not present

## 2019-10-14 DIAGNOSIS — M542 Cervicalgia: Secondary | ICD-10-CM | POA: Diagnosis not present

## 2019-10-17 DIAGNOSIS — M6281 Muscle weakness (generalized): Secondary | ICD-10-CM | POA: Diagnosis not present

## 2019-10-17 DIAGNOSIS — M542 Cervicalgia: Secondary | ICD-10-CM | POA: Diagnosis not present

## 2019-10-21 DIAGNOSIS — M6281 Muscle weakness (generalized): Secondary | ICD-10-CM | POA: Diagnosis not present

## 2019-10-21 DIAGNOSIS — M542 Cervicalgia: Secondary | ICD-10-CM | POA: Diagnosis not present

## 2019-10-24 DIAGNOSIS — M6281 Muscle weakness (generalized): Secondary | ICD-10-CM | POA: Diagnosis not present

## 2019-10-24 DIAGNOSIS — M542 Cervicalgia: Secondary | ICD-10-CM | POA: Diagnosis not present

## 2019-10-29 DIAGNOSIS — M6281 Muscle weakness (generalized): Secondary | ICD-10-CM | POA: Diagnosis not present

## 2019-10-29 DIAGNOSIS — M542 Cervicalgia: Secondary | ICD-10-CM | POA: Diagnosis not present

## 2019-10-30 ENCOUNTER — Encounter: Payer: Self-pay | Admitting: Neurology

## 2019-10-30 ENCOUNTER — Ambulatory Visit: Payer: Medicare PPO | Admitting: Neurology

## 2019-10-30 ENCOUNTER — Other Ambulatory Visit: Payer: Self-pay

## 2019-10-30 VITALS — BP 153/74 | HR 64 | Ht 68.0 in | Wt 172.0 lb

## 2019-10-30 DIAGNOSIS — M5030 Other cervical disc degeneration, unspecified cervical region: Secondary | ICD-10-CM | POA: Diagnosis not present

## 2019-10-30 HISTORY — DX: Other cervical disc degeneration, unspecified cervical region: M50.30

## 2019-10-30 NOTE — Patient Instructions (Signed)
Degenerative Disk Disease  Degenerative disk disease is a condition caused by changes that occur in the spinal disks as a person ages. Spinal disks are soft and compressible disks located between the bones of your spine (vertebrae). These disks act like shock absorbers. Degenerative disk disease can affect the whole spine. However, the neck and lower back are most often affected. Many changes can occur in the spinal disks with aging, such as:  The spinal disks may dry and shrink.  Small tears may occur in the tough, outer covering of the disk (annulus).  The disk space may become smaller due to loss of water.  Abnormal growths in the bone (spurs) may occur. This can put pressure on the nerve roots exiting the spinal canal, causing pain.  The spinal canal may become narrowed. What are the causes? This condition may be caused by:  Normal degeneration with age.  Injuries.  Certain activities and sports that cause damage. What increases the risk? The following factors may make you more likely to develop this condition:  Being overweight.  Having a family history of degenerative disk disease.  Smoking.  Sudden injury.  Doing work that requires heavy lifting. What are the signs or symptoms? Symptoms of this condition include:  Pain that varies in intensity. Some people have no pain, while others have severe pain. The location of the pain depends on the part of your backbone that is affected. You may have: ? Pain in your neck or arm if a disk in your neck area is affected. ? Pain in your back, buttocks, or legs if a disk in your lower back is affected.  Pain that becomes worse while bending or reaching up, or with twisting movements.  Pain that may start gradually and then get worse as time passes. It may also start after a major or minor injury.  Numbness or tingling in the arms or legs. How is this diagnosed? This condition may be diagnosed based on:  Your symptoms and  medical history.  A physical exam.  Imaging tests, including: ? An X-ray of the spine. ? MRI. How is this treated? This condition may be treated with:  Medicines.  Rehabilitation exercises. These activities aim to strengthen muscles in your back and abdomen to better support your spine. If treatments do not help to relieve your symptoms or you have severe pain, you may need surgery. Follow these instructions at home: Medicines  Take over-the-counter and prescription medicines only as told by your health care provider.  Do not drive or use heavy machinery while taking prescription pain medicine.  If you are taking prescription pain medicine, take actions to prevent or treat constipation. Your health care provider may recommend that you: ? Drink enough fluid to keep your urine pale yellow. ? Eat foods that are high in fiber, such as fresh fruits and vegetables, whole grains, and beans. ? Limit foods that are high in fat and processed sugars, such as fried or sweet foods. ? Take an over-the-counter or prescription medicine for constipation. Activity  Rest as told by your health care provider.  Ask your health care provider what activities are safe for you. Return to your normal activities as directed.  Avoid sitting for a long time without moving. Get up to take short walks every 1-2 hours. This is important to improve blood flow and breathing. Ask for help if you feel weak or unsteady.  Perform relaxation exercises as told by your health care provider.  Maintain good posture.    Do not lift anything that is heavier than 10 lb (4.5 kg), or the limit that you are told, until your health care provider says that it is safe.  Follow proper lifting and walking techniques as told by your health care provider. Managing pain, stiffness, and swelling   If directed, put ice on the painful area. Icing can help to relieve pain. ? Put ice in a plastic bag. ? Place a towel between your  skin and the bag. ? Leave the ice on for 20 minutes, 2-3 times a day.  If directed, apply heat to the painful area as often as told by your health care provider. Heat can reduce the stiffness of your muscles. Use the heat source that your health care provider recommends, such as a moist heat pack or a heating pad. ? Place a towel between your skin and the heat source. ? Leave the heat on for 20-30 minutes. ? Remove the heat if your skin turns bright red. This is especially important if you are unable to feel pain, heat, or cold. You may have a greater risk of getting burned. General instructions  Change your sitting, standing, and sleeping habits as told by your health care provider.  Avoid sitting in the same position for long periods of time. Change positions frequently.  Lose weight or maintain a healthy weight as told by your health care provider.  Do not use any products that contain nicotine or tobacco, such as cigarettes and e-cigarettes. If you need help quitting, ask your health care provider.  Wear supportive footwear.  Keep all follow-up visits as told by your health care provider. This is important. This may include visits for physical therapy. Contact a health care provider if you:  Have pain that does not go away within 1-4 weeks.  Lose your appetite.  Lose weight without trying. Get help right away if you:  Have severe pain.  Notice weakness in your arms, hands, or legs.  Begin to lose control of your bladder or bowel movements.  Have fevers or night sweats. Summary  Degenerative disk disease is a condition caused by changes that occur in the spinal disks as a person ages.  Degenerative disk disease can affect the whole spine. However, the neck and lower back are most often affected.  Take over-the-counter and prescription medicines only as told by your health care provider. This information is not intended to replace advice given to you by your health care  provider. Make sure you discuss any questions you have with your health care provider. Document Revised: 02/02/2017 Document Reviewed: 02/02/2017 Elsevier Patient Education  2020 Elsevier Inc.  

## 2019-10-30 NOTE — Progress Notes (Signed)
S[facility]EP [facility]INE [facility]IC    Provider:  Melvyn Novas, MD  Primary Care Physician:  Hurshel Party, NP [address] [place] [address]    Referring Provider: Hurshel Party, Np [address] [place],  [place] [address]          Chief Complaint according to patient   Patient presents with:  rm 10. presents today as a follow up from MRI testing and post working with therapy. she continues to work with therapy and indicates there has been improvement with her gait and neck.   . New Patient (Initial Visit)           HISTORY OF PRESENT ILLNESS:    Jessica [name] is a 79 year old Caucasian female patient seen here in a revisit  on 10/30/2019 . Until 2019 she had walked 3 miles 3 times a week. She was seen here since March 2021 and presented with vertigo at first- none since. Orthostatic dizziness has resolved.   She has less pain and neck tension as she underwent traction therapy, she has had a lot of massages and PT and her gait is much, much better. She feels less anxious and more trust in her own aiblity. She walked a mile and a half yesterday , with a 4 prong cane.   She had seen Dr. Dutch Quint in follow up after MRI results:  The brain MRI showed no abnormalities just some generalized cortical atrophy which is typical for the age not advanced, there were normal enhancement patterns chronic microvascular ischemic changes which are also age-related.  After infusion of contrast there was no abnormal enhancement.  No evidence of stroke normal pituitary gland.  As to her cervical spine she did have an area of severe by foraminal stenosis again this is not a spinal stenosis it is a stenosis of the exit holes of her cervical nerves between C3 and C4 and she is now treating that with decompression therapy basically stretching the cervical spine this would not be a surgical intervention at all.  I was glad that Dr. Dutch Quint referred her for the appropriate physical therapy and she is  doing doing so much better with this conservative approach.      08-19-2019 Had work up from cardiology and PCP.  she has undergone cardiac work up- reportedly her cardiologist was content with her results, and sees  in vestibular rehab. The patient kept a record of her vertigo spells in 2017 she had to, in 2018 for, none in 2019.  2020 she had 2 vertigo episodes but in 2021 between January and March 3, there were 23 vertigo episodes April 5, May 2021 and none in June. MRI brain was normal, metabolical blood work returned  all normal.  Dr. Dutch Quint has not seen her for her neck.  She has rigidity in the neck , head feels  Very heavy- some discomfort and interferes with daily mobility. Rosalita Chessman PT, [facility] has helped greatly. She has still dizziness but not a true vertigo now.  today's also started with blood pressures on 29 July 2019 show a supine blood pressure of 158/74 and a heart rate of 63 regular.  Seated blood pressure of 168/82 and a heart rate of 66 regular and is standing blood pressure of 149/78 mmHg with a heart rate of 76.  This is a 20 point drop between seated and standing position.      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  She has a  has a past medical history of Arthritis, Dizziness/ Vertigo with spinning sensation ,  Syncope , and Hypothyroidism.Marland Kitchen  She was considered borderline DM and changed that with diet.  Today her spinning sensation occurred while on the commode to urinate.   She has been hydrating well, 3 liters a day.  She has orthostatic problems. She has a PT that come to the home, had dry needling for neck ROM.  Had several orthopedic surgeries.     Social history:  Patient is retired from middle school and [employer] and lives in a household with her youngest step son-, is widowed. Husband had  Parkinson's disease, was seen by [facility] neurology. She has 2 stepsons.  Pets are present, a dog.Tobacco use: never .  ETOH use moderately in the past, none for years , Caffeine intake in form of Coffee( 1 cup in AM ) Soda( rare ) Tea ( none) or energy drinks..   Hobbies : reading.  Sleep habits are as follows: The patient's dinner time is between 5.30- 6  PM. The patient goes to bed at 10 PM and continues to sleep for  hours, wakes for 1-2 bathroom breaks.   The preferred sleep position is right side, with the support of  pillows.  Dreams are reportedly frequent/vivid.  6.30  AM is the usual rise time. The patient wakes up spontaneously.  She reports not feeling refreshed or restored in AM.  Review of Systems: Out of a complete 14 system review, the patient complains of only the following symptoms, and all other reviewed systems are negative  Feeling in motion when not moving, spinning sensation when at worst case scenario, left foot drop, caused by tendon injury   Stiffness of gait, but improved.       Social History   Socioeconomic History  . Marital status: Widowed    Spouse name: Not on file  . Number of children: Not on file  . Years of education: Not on file  . Highest education level: Not on file  Occupational History  . Not on file  Tobacco Use  . Smoking status: Never Smoker  . Smokeless tobacco: Never Used  Vaping Use  . Vaping Use: Never used  Substance and Sexual Activity  . Alcohol use: Not Currently    Comment: rarely  . Drug use: No  . Sexual activity: Never  Other Topics Concern  . Not on file  Social History Narrative  . Not on file   Social Determinants of Health   Financial Resource Strain:   . Difficulty of Paying Living Expenses: Not on file  Food Insecurity:   . Worried About Programme researcher, broadcasting/film/video in the Last Year: Not on file  . Ran Out of Food in the Last Year: Not on file  Transportation Needs:   . Lack of Transportation (Medical): Not on file  . Lack of Transportation (Non-Medical): Not on file  Physical  Activity:   . Days of Exercise per Week: Not on file  . Minutes of Exercise per Session: Not on file  Stress:   . Feeling of Stress : Not on file  Social Connections:   . Frequency of Communication with Friends and Family: Not on file  . Frequency of Social Gatherings with Friends and Family: Not on file  . Attends Religious Services: Not on file  . Active  Member of Clubs or Organizations: Not on file  . Attends BankerClub or Organization Meetings: Not on file  . Marital Status: Not on file    Family History  Problem Relation Age of Onset  . Dementia Mother   . Osteoporosis Mother   . Other Father        Senescence  . Prostate cancer Maternal Grandfather     Past Medical History:  Diagnosis Date  . Arthritis    oa  . Auditory vertigo 04/25/2019  . Cervical spinal stenosis 12/13/2011  . Controlled type 2 diabetes mellitus with diabetic nephropathy, without long-term current use of insulin (HCC) 08/21/2017  . Dizziness    related seeing vestibular md for inner ear  . Essential hypertension 11/23/2018  . Hyperlipidemia 08/21/2017  . Hypothyroidism   . Multiple falls 04/25/2019  . Orthostatic hypotension dysautonomic syndrome (HCC) 04/25/2019  . Post-traumatic headache, not intractable 04/25/2019  . 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 Syncope 08/21/2017  . Tremor observed on examination 04/25/2019    Past Surgical History:  Procedure Laterality Date  . ANTERIOR CERVICAL DECOMP/DISCECTOMY FUSION  12/13/2011   Procedure: ANTERIOR CERVICAL DECOMPRESSION/DISCECTOMY FUSION 3 LEVELS;  Surgeon: Temple PaciniHenry A Pool, MD;  Location: [facility];  Service: Neurosurgery;  Laterality: N/A;  Cervical Five-Six Cervical Six-Seven Cervical Seven-Thoracic One Anterior cervical decompression/diskectomy/fusion/plating/allograft  . BREAST BIOPSY Right 2000  . CARPAL TUNNEL RELEASE     right  . EYE SURGERY     cataract bilateral  . FOOT ARTHRODESIS Left 06/25/2015     Procedure: HALLUX INTERPHALANGEAL JOINT  ARTHRODESIS;  Surgeon: Toni ArthursJohn Hewitt, MD;  Location: [facility];  Service: Orthopedics;  Laterality: Left;  Marland Kitchen. GASTROCNEMIUS RECESSION Left 06/25/2015   Procedure: LEFT GASTROC RECESSION ;  Surgeon: Toni ArthursJohn Hewitt, MD;  Location: [facility];  Service: Orthopedics;  Laterality: Left;  . HIP FRACTURE SURGERY Left   . TENDON REPAIR Left 06/25/2015   Procedure: LEFT ANTERIOR TIBIAL TENDON RECONSTRUCTION;  EXTENSOR HALLUCIS LONGUS TRANSFER ;  Surgeon: Toni ArthursJohn Hewitt, MD;  Location: [facility];  Service: Orthopedics;  Laterality: Left;  . TOTAL KNEE ARTHROPLASTY Right 01/25/2016   Procedure: RIGHT TOTAL KNEE ARTHROPLASTY;  Surgeon: Durene RomansMatthew Olin, MD;  Location: [facility];  Service: Orthopedics;  Laterality: Right;  . TUBAL LIGATION       Current Outpatient Medications on File Prior to Visit  Medication Sig Dispense Refill  . Calcium Carb-Cholecalciferol (CALCIUM 500+D PO) Take 1 tablet by mouth daily.    . citalopram (CELEXA) 10 MG tablet Take 10 mg by mouth at bedtime.    . ergocalciferol (VITAMIN D2) 50000 UNITS capsule Take 50,000 Units by mouth once a week. Due on 10/15     . FORTEO 600 MCG/2.4ML SOPN ADM 0.08 ML  D    . levothyroxine (SYNTHROID) 100 MCG tablet TK 1 T PO D    . meclizine (ANTIVERT) 25 MG tablet Take 25 mg by mouth as needed for dizziness.    . metoprolol succinate (TOPROL-XL) 25 MG 24 hr tablet TAKE 1 TABLET(25 MG) BY MOUTH DAILY 90 tablet 1  . Multiple Vitamin (MULTIVITAMIN WITH MINERALS) TABS tablet Take 7 tablets by mouth daily. [place] Multivitamin regimen    . rosuvastatin (CRESTOR) 5 MG tablet TK 1 T PO 2 TIMES A WK     No current facility-administered medications on file prior to visit.    No Known Allergies  Physical exam:  Today's  Vitals   10/30/19 0811  BP: (!) 153/74  Pulse: 64  Weight: 172 lb (78 kg)  Height: 5\' 8"  (1.727 m)   Body mass index is 26.15 kg/m.   Wt Readings from  Last 3 Encounters:  10/30/19 172 lb (78 kg)  07/29/19 176 lb (79.8 kg)  06/13/19 179 lb (81.2 kg)     Ht Readings from Last 3 Encounters:  10/30/19 5\' 8"  (1.727 m)  07/29/19 5\' 8"  (1.727 m)  06/13/19 5' 8.5" (1.74 m)      General: The patient is awake, alert and appears not in acute distress. The patient is well groomed. Head: Normocephalic, atraumatic. Neck is supple. Mallampati 3.,  neck circumference:14.5 inches . Nasal airflow patent.  Retrognathia is not  seen.  Dental status:  dentures Cardiovascular:  irregular rate and cardiac rhythm by pulse,  without distended neck veins. Respiratory: Lungs are clear to auscultation.  Skin:  Without evidence of ankle edema, or rash. Trunk: The patient's posture is erect, not stooped.    Neurologic exam : The patient is awake and alert, oriented to place and time.   Memory subjective described as intact.  Attention span & concentration ability appears normal.  Speech is fluent,  without  dysarthria, dysphonia or aphasia.  Mood and affect are appropriate.   Cranial nerves: no loss of smell or taste reported  Pupils are equal and briskly reactive to light. Funduscopic exam ; status post cataract surgery-  Extraocular movements in vertical and horizontal planes were intact and without nystagmus. No Diplopia. Visual fields by finger perimetry are intact. Hearing was impaired on the left- uses a hearing aid.  Facial sensation intact to fine touch.  Facial motor strength is symmetric and tongue and uvula move midline.  I don't see a masked face but hear a hoarse voice. Slight jaw tremor, no tongue tremor.  Neck ROM : rotation, tilt and flexion extension were restricted  for age and shoulder shrug was lower on the left   none in the right  Motor exam:  increased tone in the left arm, biceps, cog-wheeling. Symmetric bulk, \ ROM.   Normal tone without cog-wheeling, symmetric grip strength . Coordination: Rapid alternating movements in the  fingers/hands were of normal speed.  The Finger-to-nose maneuver was impaired - there was  evidence of ataxia, dysmetria or tremor. There is tremor in both hands.    Gait and station: Patient could rise unassisted from a seated position, walked without assistive device.  Stance is of normal width/ base and the patient turned with 4 steps.  She walked very rigid, erect and showed a clear foot drop.  Toe and heel walk were deferred.  Deep tendon reflexes: in the  upper and lower extremities are symmetric and intact.  Babinski response was deferred.     Jessica [name] is a 79 year old Caucasian right-handed female and retired who had been evaluated in the year 2019 for repeated syncopal episodes.   Her exam at that time was unremarkable and the evaluations were 30-day Holter monitor revealed no cardiac abnormalities.   Even at that time she had multiple symptoms / events of lightheadedness and dizziness.  She had premature atrial count tractions on her EKG and her cardiologist chose to put her on a beta-blocker to reduce these extrasystoles.  There was no chest pain.  In the meantime her gait has become more unsteady and she feels more insecure as she walks plays a certain level of hypervigilance.  We started our exam  after a lengthy interview with orthostatic blood pressure measures and she shows a quite significant drop in her supine to standing blood pressure a total of 28 mmHg by her systolic blood pressure dropped her diastolic blood pressure remained stable and her heart rate varied not very greatly.  Between supine and seated position she felt dizzy and she felt as if in motion although she was not moving, for the second part changing from a seated to a standing position she felt lightheaded as well.   Her gait is significantly affected there is #1 her foot drop she walks like a person with a neuropathy that does not feel the ground she walks on there is an insecurity of where to place her feet  and her tendency is to walk the ground to see where she steps, because she does not have a reliable feedback mechanism. Has the typical electric shock sensations arising from her feet and ankles with tops of my feet and the tops of her feet tingle.   Further noted were bilateral probably essential tremor with some cogwheeling over the biceps area usually a sign of Parkinson's disease but she had a history of restricted range of motion for neck and shoulder on the left and actually had a cervical spine surgery fusion.   The vestibular organ is a possible cause because rapid changing of the head to the left can bring on a spinning sensation of vertigo.  She has this problem rarely.    Has the typical electric shock sensations arising from her feet and ankles with tops of my feet and the tops of her feet tingle. Unchanged.    #2 orthopedic reasons mainly causing foot drop, status post  Right knee and left hip repair.      #3 orthostatic dysautonomia-today's also started with blood pressures on 29 July 2019 show a supine blood pressure of 158/74 and a heart rate of 63 regular.  Seated blood pressure of 168/82 and a heart rate of 66 regular and is standing blood pressure of 149/78 mmHg with a heart rate of 76.  This is a 20 point drop between seated and standing position.  #4. she very likely some vestibulitis.  The vestibular organ is a possible cause because rapid changing of the head to the left can bring on a spinning sensation of vertigo.   She has this problem rarely now- she reports a sharp pain if she sneezes, coughs or bears down. Balance d training with Quincy Carnes, PT     #5She broke her odontoid process C 2 in a fall and has a record of 3 concussions. 2 with the last falls.  MRI of the cervical spine  Showed stenosis.      After spending a total time of  25 minutes face to face with  time for physical and neurologic examination, review of laboratory studies,  personal review of imaging  studies, reports and results of other testing and review of referral information / records as far as provided in visit, I have established the following assessments   My Plan is to proceed with:  1) vertigo  improved with vestibular rehab. No further treatment- continue exercises  2) MRI C spine with biforminal stenosis - improved  Neck stiffness and posture.  3) continue PT, I do not usually endorse chiropractic treatment but I would endorse a chiropractor that would not check or apply pressure to her neck.  I think it is worse trying this out.  I would like  to thank Waynetta Sandy, Amy A, NP, for allowing me to meet with and to take care of this pleasant patient.   I plan to follow up  Through our NP in 12 month   CC: I will share my notes with PCP and Dr Dutch Quint, Dr Roanna Raider.  Electronically signed by: Melvyn Novas, MD 10/30/2019 8:36 AM  [facility] and [facility] Board certified by The [employer] of Sleep Medicine and Diplomate of the [employer] of Sleep Medicine. Board certified In Neurology through the ABPN, Fellow of the [employer] of Neurology. Medical Director of [facility].

## 2019-10-31 DIAGNOSIS — M6281 Muscle weakness (generalized): Secondary | ICD-10-CM | POA: Diagnosis not present

## 2019-10-31 DIAGNOSIS — M542 Cervicalgia: Secondary | ICD-10-CM | POA: Diagnosis not present

## 2019-11-04 DIAGNOSIS — M6281 Muscle weakness (generalized): Secondary | ICD-10-CM | POA: Diagnosis not present

## 2019-11-04 DIAGNOSIS — M542 Cervicalgia: Secondary | ICD-10-CM | POA: Diagnosis not present

## 2019-11-07 DIAGNOSIS — M542 Cervicalgia: Secondary | ICD-10-CM | POA: Diagnosis not present

## 2019-11-07 DIAGNOSIS — M6281 Muscle weakness (generalized): Secondary | ICD-10-CM | POA: Diagnosis not present

## 2019-11-13 DIAGNOSIS — M6281 Muscle weakness (generalized): Secondary | ICD-10-CM | POA: Diagnosis not present

## 2019-11-13 DIAGNOSIS — M542 Cervicalgia: Secondary | ICD-10-CM | POA: Diagnosis not present

## 2019-11-14 DIAGNOSIS — M47812 Spondylosis without myelopathy or radiculopathy, cervical region: Secondary | ICD-10-CM | POA: Diagnosis not present

## 2019-11-18 DIAGNOSIS — M6281 Muscle weakness (generalized): Secondary | ICD-10-CM | POA: Diagnosis not present

## 2019-11-18 DIAGNOSIS — M542 Cervicalgia: Secondary | ICD-10-CM | POA: Diagnosis not present

## 2019-11-21 DIAGNOSIS — M542 Cervicalgia: Secondary | ICD-10-CM | POA: Diagnosis not present

## 2019-11-21 DIAGNOSIS — M6281 Muscle weakness (generalized): Secondary | ICD-10-CM | POA: Diagnosis not present

## 2019-11-25 DIAGNOSIS — M542 Cervicalgia: Secondary | ICD-10-CM | POA: Diagnosis not present

## 2019-11-25 DIAGNOSIS — M6281 Muscle weakness (generalized): Secondary | ICD-10-CM | POA: Diagnosis not present

## 2019-11-28 DIAGNOSIS — M6281 Muscle weakness (generalized): Secondary | ICD-10-CM | POA: Diagnosis not present

## 2019-11-28 DIAGNOSIS — M542 Cervicalgia: Secondary | ICD-10-CM | POA: Diagnosis not present

## 2019-12-02 DIAGNOSIS — M6281 Muscle weakness (generalized): Secondary | ICD-10-CM | POA: Diagnosis not present

## 2019-12-02 DIAGNOSIS — M542 Cervicalgia: Secondary | ICD-10-CM | POA: Diagnosis not present

## 2019-12-03 DIAGNOSIS — E119 Type 2 diabetes mellitus without complications: Secondary | ICD-10-CM | POA: Diagnosis not present

## 2019-12-03 DIAGNOSIS — M1990 Unspecified osteoarthritis, unspecified site: Secondary | ICD-10-CM | POA: Diagnosis not present

## 2019-12-03 DIAGNOSIS — I491 Atrial premature depolarization: Secondary | ICD-10-CM | POA: Diagnosis not present

## 2019-12-03 DIAGNOSIS — H8113 Benign paroxysmal vertigo, bilateral: Secondary | ICD-10-CM | POA: Diagnosis not present

## 2019-12-03 DIAGNOSIS — E559 Vitamin D deficiency, unspecified: Secondary | ICD-10-CM | POA: Diagnosis not present

## 2019-12-03 DIAGNOSIS — E785 Hyperlipidemia, unspecified: Secondary | ICD-10-CM | POA: Diagnosis not present

## 2019-12-03 DIAGNOSIS — E039 Hypothyroidism, unspecified: Secondary | ICD-10-CM | POA: Diagnosis not present

## 2019-12-03 DIAGNOSIS — M810 Age-related osteoporosis without current pathological fracture: Secondary | ICD-10-CM | POA: Diagnosis not present

## 2019-12-03 DIAGNOSIS — F411 Generalized anxiety disorder: Secondary | ICD-10-CM | POA: Diagnosis not present

## 2019-12-05 DIAGNOSIS — M6281 Muscle weakness (generalized): Secondary | ICD-10-CM | POA: Diagnosis not present

## 2019-12-05 DIAGNOSIS — M542 Cervicalgia: Secondary | ICD-10-CM | POA: Diagnosis not present

## 2019-12-09 DIAGNOSIS — M542 Cervicalgia: Secondary | ICD-10-CM | POA: Diagnosis not present

## 2019-12-09 DIAGNOSIS — M6281 Muscle weakness (generalized): Secondary | ICD-10-CM | POA: Diagnosis not present

## 2019-12-12 DIAGNOSIS — M6281 Muscle weakness (generalized): Secondary | ICD-10-CM | POA: Diagnosis not present

## 2019-12-12 DIAGNOSIS — M542 Cervicalgia: Secondary | ICD-10-CM | POA: Diagnosis not present

## 2019-12-16 DIAGNOSIS — M542 Cervicalgia: Secondary | ICD-10-CM | POA: Diagnosis not present

## 2019-12-16 DIAGNOSIS — M6281 Muscle weakness (generalized): Secondary | ICD-10-CM | POA: Diagnosis not present

## 2019-12-19 DIAGNOSIS — M6281 Muscle weakness (generalized): Secondary | ICD-10-CM | POA: Diagnosis not present

## 2019-12-19 DIAGNOSIS — M542 Cervicalgia: Secondary | ICD-10-CM | POA: Diagnosis not present

## 2019-12-25 DIAGNOSIS — N959 Unspecified menopausal and perimenopausal disorder: Secondary | ICD-10-CM | POA: Diagnosis not present

## 2019-12-25 DIAGNOSIS — M8589 Other specified disorders of bone density and structure, multiple sites: Secondary | ICD-10-CM | POA: Diagnosis not present

## 2019-12-27 ENCOUNTER — Other Ambulatory Visit: Payer: Self-pay | Admitting: Family

## 2020-03-05 DIAGNOSIS — E559 Vitamin D deficiency, unspecified: Secondary | ICD-10-CM | POA: Diagnosis not present

## 2020-03-05 DIAGNOSIS — H8113 Benign paroxysmal vertigo, bilateral: Secondary | ICD-10-CM | POA: Diagnosis not present

## 2020-03-05 DIAGNOSIS — M810 Age-related osteoporosis without current pathological fracture: Secondary | ICD-10-CM | POA: Diagnosis not present

## 2020-03-05 DIAGNOSIS — E785 Hyperlipidemia, unspecified: Secondary | ICD-10-CM | POA: Diagnosis not present

## 2020-03-05 DIAGNOSIS — E119 Type 2 diabetes mellitus without complications: Secondary | ICD-10-CM | POA: Diagnosis not present

## 2020-03-05 DIAGNOSIS — E039 Hypothyroidism, unspecified: Secondary | ICD-10-CM | POA: Diagnosis not present

## 2020-03-05 DIAGNOSIS — I491 Atrial premature depolarization: Secondary | ICD-10-CM | POA: Diagnosis not present

## 2020-03-05 DIAGNOSIS — F411 Generalized anxiety disorder: Secondary | ICD-10-CM | POA: Diagnosis not present

## 2020-03-05 DIAGNOSIS — M1990 Unspecified osteoarthritis, unspecified site: Secondary | ICD-10-CM | POA: Diagnosis not present

## 2020-03-16 ENCOUNTER — Encounter: Payer: Self-pay | Admitting: Neurology

## 2020-03-17 ENCOUNTER — Other Ambulatory Visit: Payer: Self-pay | Admitting: *Deleted

## 2020-03-25 DIAGNOSIS — M7912 Myalgia of auxiliary muscles, head and neck: Secondary | ICD-10-CM | POA: Diagnosis not present

## 2020-03-25 DIAGNOSIS — M4323 Fusion of spine, cervicothoracic region: Secondary | ICD-10-CM | POA: Diagnosis not present

## 2020-03-25 DIAGNOSIS — M546 Pain in thoracic spine: Secondary | ICD-10-CM | POA: Diagnosis not present

## 2020-03-25 DIAGNOSIS — M9902 Segmental and somatic dysfunction of thoracic region: Secondary | ICD-10-CM | POA: Diagnosis not present

## 2020-03-25 DIAGNOSIS — M9901 Segmental and somatic dysfunction of cervical region: Secondary | ICD-10-CM | POA: Diagnosis not present

## 2020-03-30 DIAGNOSIS — M7912 Myalgia of auxiliary muscles, head and neck: Secondary | ICD-10-CM | POA: Diagnosis not present

## 2020-03-30 DIAGNOSIS — M546 Pain in thoracic spine: Secondary | ICD-10-CM | POA: Diagnosis not present

## 2020-03-30 DIAGNOSIS — M4323 Fusion of spine, cervicothoracic region: Secondary | ICD-10-CM | POA: Diagnosis not present

## 2020-03-30 DIAGNOSIS — M9901 Segmental and somatic dysfunction of cervical region: Secondary | ICD-10-CM | POA: Diagnosis not present

## 2020-03-30 DIAGNOSIS — M9902 Segmental and somatic dysfunction of thoracic region: Secondary | ICD-10-CM | POA: Diagnosis not present

## 2020-03-31 DIAGNOSIS — M4323 Fusion of spine, cervicothoracic region: Secondary | ICD-10-CM | POA: Diagnosis not present

## 2020-03-31 DIAGNOSIS — M9902 Segmental and somatic dysfunction of thoracic region: Secondary | ICD-10-CM | POA: Diagnosis not present

## 2020-03-31 DIAGNOSIS — M7912 Myalgia of auxiliary muscles, head and neck: Secondary | ICD-10-CM | POA: Diagnosis not present

## 2020-03-31 DIAGNOSIS — M546 Pain in thoracic spine: Secondary | ICD-10-CM | POA: Diagnosis not present

## 2020-03-31 DIAGNOSIS — M9901 Segmental and somatic dysfunction of cervical region: Secondary | ICD-10-CM | POA: Diagnosis not present

## 2020-04-01 DIAGNOSIS — M546 Pain in thoracic spine: Secondary | ICD-10-CM | POA: Diagnosis not present

## 2020-04-01 DIAGNOSIS — M9901 Segmental and somatic dysfunction of cervical region: Secondary | ICD-10-CM | POA: Diagnosis not present

## 2020-04-01 DIAGNOSIS — M7912 Myalgia of auxiliary muscles, head and neck: Secondary | ICD-10-CM | POA: Diagnosis not present

## 2020-04-01 DIAGNOSIS — M9902 Segmental and somatic dysfunction of thoracic region: Secondary | ICD-10-CM | POA: Diagnosis not present

## 2020-04-01 DIAGNOSIS — M4323 Fusion of spine, cervicothoracic region: Secondary | ICD-10-CM | POA: Diagnosis not present

## 2020-04-06 DIAGNOSIS — M9901 Segmental and somatic dysfunction of cervical region: Secondary | ICD-10-CM | POA: Diagnosis not present

## 2020-04-06 DIAGNOSIS — M4323 Fusion of spine, cervicothoracic region: Secondary | ICD-10-CM | POA: Diagnosis not present

## 2020-04-06 DIAGNOSIS — M9902 Segmental and somatic dysfunction of thoracic region: Secondary | ICD-10-CM | POA: Diagnosis not present

## 2020-04-06 DIAGNOSIS — M7912 Myalgia of auxiliary muscles, head and neck: Secondary | ICD-10-CM | POA: Diagnosis not present

## 2020-04-06 DIAGNOSIS — M546 Pain in thoracic spine: Secondary | ICD-10-CM | POA: Diagnosis not present

## 2020-04-08 ENCOUNTER — Other Ambulatory Visit: Payer: Self-pay | Admitting: Internal Medicine

## 2020-04-08 DIAGNOSIS — M9901 Segmental and somatic dysfunction of cervical region: Secondary | ICD-10-CM | POA: Diagnosis not present

## 2020-04-08 DIAGNOSIS — M9902 Segmental and somatic dysfunction of thoracic region: Secondary | ICD-10-CM | POA: Diagnosis not present

## 2020-04-08 DIAGNOSIS — M7912 Myalgia of auxiliary muscles, head and neck: Secondary | ICD-10-CM | POA: Diagnosis not present

## 2020-04-08 DIAGNOSIS — Z1231 Encounter for screening mammogram for malignant neoplasm of breast: Secondary | ICD-10-CM

## 2020-04-08 DIAGNOSIS — M546 Pain in thoracic spine: Secondary | ICD-10-CM | POA: Diagnosis not present

## 2020-04-08 DIAGNOSIS — M4323 Fusion of spine, cervicothoracic region: Secondary | ICD-10-CM | POA: Diagnosis not present

## 2020-04-13 DIAGNOSIS — M9902 Segmental and somatic dysfunction of thoracic region: Secondary | ICD-10-CM | POA: Diagnosis not present

## 2020-04-13 DIAGNOSIS — M546 Pain in thoracic spine: Secondary | ICD-10-CM | POA: Diagnosis not present

## 2020-04-13 DIAGNOSIS — M9901 Segmental and somatic dysfunction of cervical region: Secondary | ICD-10-CM | POA: Diagnosis not present

## 2020-04-13 DIAGNOSIS — M7912 Myalgia of auxiliary muscles, head and neck: Secondary | ICD-10-CM | POA: Diagnosis not present

## 2020-04-13 DIAGNOSIS — M4323 Fusion of spine, cervicothoracic region: Secondary | ICD-10-CM | POA: Diagnosis not present

## 2020-04-15 DIAGNOSIS — M546 Pain in thoracic spine: Secondary | ICD-10-CM | POA: Diagnosis not present

## 2020-04-15 DIAGNOSIS — M4323 Fusion of spine, cervicothoracic region: Secondary | ICD-10-CM | POA: Diagnosis not present

## 2020-04-15 DIAGNOSIS — M9901 Segmental and somatic dysfunction of cervical region: Secondary | ICD-10-CM | POA: Diagnosis not present

## 2020-04-15 DIAGNOSIS — M7912 Myalgia of auxiliary muscles, head and neck: Secondary | ICD-10-CM | POA: Diagnosis not present

## 2020-04-15 DIAGNOSIS — M9902 Segmental and somatic dysfunction of thoracic region: Secondary | ICD-10-CM | POA: Diagnosis not present

## 2020-04-20 DIAGNOSIS — M9901 Segmental and somatic dysfunction of cervical region: Secondary | ICD-10-CM | POA: Diagnosis not present

## 2020-04-20 DIAGNOSIS — M546 Pain in thoracic spine: Secondary | ICD-10-CM | POA: Diagnosis not present

## 2020-04-20 DIAGNOSIS — M4323 Fusion of spine, cervicothoracic region: Secondary | ICD-10-CM | POA: Diagnosis not present

## 2020-04-20 DIAGNOSIS — M9902 Segmental and somatic dysfunction of thoracic region: Secondary | ICD-10-CM | POA: Diagnosis not present

## 2020-04-20 DIAGNOSIS — M7912 Myalgia of auxiliary muscles, head and neck: Secondary | ICD-10-CM | POA: Diagnosis not present

## 2020-04-22 DIAGNOSIS — M9902 Segmental and somatic dysfunction of thoracic region: Secondary | ICD-10-CM | POA: Diagnosis not present

## 2020-04-22 DIAGNOSIS — M4323 Fusion of spine, cervicothoracic region: Secondary | ICD-10-CM | POA: Diagnosis not present

## 2020-04-22 DIAGNOSIS — M7912 Myalgia of auxiliary muscles, head and neck: Secondary | ICD-10-CM | POA: Diagnosis not present

## 2020-04-22 DIAGNOSIS — M9901 Segmental and somatic dysfunction of cervical region: Secondary | ICD-10-CM | POA: Diagnosis not present

## 2020-04-22 DIAGNOSIS — M546 Pain in thoracic spine: Secondary | ICD-10-CM | POA: Diagnosis not present

## 2020-04-27 DIAGNOSIS — M9901 Segmental and somatic dysfunction of cervical region: Secondary | ICD-10-CM | POA: Diagnosis not present

## 2020-04-27 DIAGNOSIS — M7912 Myalgia of auxiliary muscles, head and neck: Secondary | ICD-10-CM | POA: Diagnosis not present

## 2020-04-27 DIAGNOSIS — M4323 Fusion of spine, cervicothoracic region: Secondary | ICD-10-CM | POA: Diagnosis not present

## 2020-04-27 DIAGNOSIS — M546 Pain in thoracic spine: Secondary | ICD-10-CM | POA: Diagnosis not present

## 2020-04-27 DIAGNOSIS — M9902 Segmental and somatic dysfunction of thoracic region: Secondary | ICD-10-CM | POA: Diagnosis not present

## 2020-04-29 DIAGNOSIS — M9901 Segmental and somatic dysfunction of cervical region: Secondary | ICD-10-CM | POA: Diagnosis not present

## 2020-04-29 DIAGNOSIS — M9902 Segmental and somatic dysfunction of thoracic region: Secondary | ICD-10-CM | POA: Diagnosis not present

## 2020-04-29 DIAGNOSIS — M7912 Myalgia of auxiliary muscles, head and neck: Secondary | ICD-10-CM | POA: Diagnosis not present

## 2020-04-29 DIAGNOSIS — M4323 Fusion of spine, cervicothoracic region: Secondary | ICD-10-CM | POA: Diagnosis not present

## 2020-04-29 DIAGNOSIS — M546 Pain in thoracic spine: Secondary | ICD-10-CM | POA: Diagnosis not present

## 2020-05-04 DIAGNOSIS — M546 Pain in thoracic spine: Secondary | ICD-10-CM | POA: Diagnosis not present

## 2020-05-04 DIAGNOSIS — M9902 Segmental and somatic dysfunction of thoracic region: Secondary | ICD-10-CM | POA: Diagnosis not present

## 2020-05-04 DIAGNOSIS — M7912 Myalgia of auxiliary muscles, head and neck: Secondary | ICD-10-CM | POA: Diagnosis not present

## 2020-05-04 DIAGNOSIS — M9901 Segmental and somatic dysfunction of cervical region: Secondary | ICD-10-CM | POA: Diagnosis not present

## 2020-05-04 DIAGNOSIS — M4323 Fusion of spine, cervicothoracic region: Secondary | ICD-10-CM | POA: Diagnosis not present

## 2020-05-06 DIAGNOSIS — M7912 Myalgia of auxiliary muscles, head and neck: Secondary | ICD-10-CM | POA: Diagnosis not present

## 2020-05-06 DIAGNOSIS — M4323 Fusion of spine, cervicothoracic region: Secondary | ICD-10-CM | POA: Diagnosis not present

## 2020-05-06 DIAGNOSIS — M546 Pain in thoracic spine: Secondary | ICD-10-CM | POA: Diagnosis not present

## 2020-05-06 DIAGNOSIS — M9902 Segmental and somatic dysfunction of thoracic region: Secondary | ICD-10-CM | POA: Diagnosis not present

## 2020-05-06 DIAGNOSIS — M9901 Segmental and somatic dysfunction of cervical region: Secondary | ICD-10-CM | POA: Diagnosis not present

## 2020-05-11 DIAGNOSIS — M546 Pain in thoracic spine: Secondary | ICD-10-CM | POA: Diagnosis not present

## 2020-05-11 DIAGNOSIS — M9902 Segmental and somatic dysfunction of thoracic region: Secondary | ICD-10-CM | POA: Diagnosis not present

## 2020-05-11 DIAGNOSIS — M9901 Segmental and somatic dysfunction of cervical region: Secondary | ICD-10-CM | POA: Diagnosis not present

## 2020-05-11 DIAGNOSIS — M4323 Fusion of spine, cervicothoracic region: Secondary | ICD-10-CM | POA: Diagnosis not present

## 2020-05-11 DIAGNOSIS — M7912 Myalgia of auxiliary muscles, head and neck: Secondary | ICD-10-CM | POA: Diagnosis not present

## 2020-05-12 ENCOUNTER — Inpatient Hospital Stay: Admission: RE | Admit: 2020-05-12 | Payer: Medicare PPO | Source: Ambulatory Visit

## 2020-05-12 DIAGNOSIS — Z1231 Encounter for screening mammogram for malignant neoplasm of breast: Secondary | ICD-10-CM

## 2020-05-16 ENCOUNTER — Ambulatory Visit
Admission: RE | Admit: 2020-05-16 | Discharge: 2020-05-16 | Disposition: A | Discharge status: Home / Self Care | Payer: Medicare PPO | Source: Ambulatory Visit | Attending: Internal Medicine | Admitting: Internal Medicine

## 2020-05-16 DIAGNOSIS — Z1231 Encounter for screening mammogram for malignant neoplasm of breast: Secondary | ICD-10-CM

## 2020-05-18 DIAGNOSIS — M7912 Myalgia of auxiliary muscles, head and neck: Secondary | ICD-10-CM | POA: Diagnosis not present

## 2020-05-18 DIAGNOSIS — M9902 Segmental and somatic dysfunction of thoracic region: Secondary | ICD-10-CM | POA: Diagnosis not present

## 2020-05-18 DIAGNOSIS — M4323 Fusion of spine, cervicothoracic region: Secondary | ICD-10-CM | POA: Diagnosis not present

## 2020-05-18 DIAGNOSIS — M9901 Segmental and somatic dysfunction of cervical region: Secondary | ICD-10-CM | POA: Diagnosis not present

## 2020-05-18 DIAGNOSIS — M546 Pain in thoracic spine: Secondary | ICD-10-CM | POA: Diagnosis not present

## 2020-05-20 DIAGNOSIS — Z9181 History of falling: Secondary | ICD-10-CM | POA: Diagnosis not present

## 2020-05-20 DIAGNOSIS — Z1331 Encounter for screening for depression: Secondary | ICD-10-CM | POA: Diagnosis not present

## 2020-05-20 DIAGNOSIS — Z139 Encounter for screening, unspecified: Secondary | ICD-10-CM | POA: Diagnosis not present

## 2020-05-20 DIAGNOSIS — E785 Hyperlipidemia, unspecified: Secondary | ICD-10-CM | POA: Diagnosis not present

## 2020-05-20 DIAGNOSIS — Z0000 Encounter for general adult medical examination without abnormal findings: Secondary | ICD-10-CM | POA: Diagnosis not present

## 2020-05-22 DIAGNOSIS — M810 Age-related osteoporosis without current pathological fracture: Secondary | ICD-10-CM | POA: Diagnosis not present

## 2020-05-25 DIAGNOSIS — M546 Pain in thoracic spine: Secondary | ICD-10-CM | POA: Diagnosis not present

## 2020-05-25 DIAGNOSIS — M4323 Fusion of spine, cervicothoracic region: Secondary | ICD-10-CM | POA: Diagnosis not present

## 2020-05-25 DIAGNOSIS — M9901 Segmental and somatic dysfunction of cervical region: Secondary | ICD-10-CM | POA: Diagnosis not present

## 2020-05-25 DIAGNOSIS — M7912 Myalgia of auxiliary muscles, head and neck: Secondary | ICD-10-CM | POA: Diagnosis not present

## 2020-05-25 DIAGNOSIS — M9902 Segmental and somatic dysfunction of thoracic region: Secondary | ICD-10-CM | POA: Diagnosis not present

## 2020-06-12 DIAGNOSIS — E559 Vitamin D deficiency, unspecified: Secondary | ICD-10-CM | POA: Diagnosis not present

## 2020-06-12 DIAGNOSIS — I491 Atrial premature depolarization: Secondary | ICD-10-CM | POA: Diagnosis not present

## 2020-06-12 DIAGNOSIS — E119 Type 2 diabetes mellitus without complications: Secondary | ICD-10-CM | POA: Diagnosis not present

## 2020-06-12 DIAGNOSIS — H8113 Benign paroxysmal vertigo, bilateral: Secondary | ICD-10-CM | POA: Diagnosis not present

## 2020-06-12 DIAGNOSIS — F411 Generalized anxiety disorder: Secondary | ICD-10-CM | POA: Diagnosis not present

## 2020-06-12 DIAGNOSIS — M1990 Unspecified osteoarthritis, unspecified site: Secondary | ICD-10-CM | POA: Diagnosis not present

## 2020-06-12 DIAGNOSIS — E785 Hyperlipidemia, unspecified: Secondary | ICD-10-CM | POA: Diagnosis not present

## 2020-06-12 DIAGNOSIS — M810 Age-related osteoporosis without current pathological fracture: Secondary | ICD-10-CM | POA: Diagnosis not present

## 2020-06-12 DIAGNOSIS — E039 Hypothyroidism, unspecified: Secondary | ICD-10-CM | POA: Diagnosis not present

## 2020-06-15 DIAGNOSIS — M4323 Fusion of spine, cervicothoracic region: Secondary | ICD-10-CM | POA: Diagnosis not present

## 2020-06-15 DIAGNOSIS — M9901 Segmental and somatic dysfunction of cervical region: Secondary | ICD-10-CM | POA: Diagnosis not present

## 2020-06-15 DIAGNOSIS — M546 Pain in thoracic spine: Secondary | ICD-10-CM | POA: Diagnosis not present

## 2020-06-15 DIAGNOSIS — M7912 Myalgia of auxiliary muscles, head and neck: Secondary | ICD-10-CM | POA: Diagnosis not present

## 2020-06-15 DIAGNOSIS — M9902 Segmental and somatic dysfunction of thoracic region: Secondary | ICD-10-CM | POA: Diagnosis not present

## 2020-06-22 DIAGNOSIS — M9902 Segmental and somatic dysfunction of thoracic region: Secondary | ICD-10-CM | POA: Diagnosis not present

## 2020-06-22 DIAGNOSIS — M7912 Myalgia of auxiliary muscles, head and neck: Secondary | ICD-10-CM | POA: Diagnosis not present

## 2020-06-22 DIAGNOSIS — M546 Pain in thoracic spine: Secondary | ICD-10-CM | POA: Diagnosis not present

## 2020-06-22 DIAGNOSIS — M4323 Fusion of spine, cervicothoracic region: Secondary | ICD-10-CM | POA: Diagnosis not present

## 2020-06-22 DIAGNOSIS — M9901 Segmental and somatic dysfunction of cervical region: Secondary | ICD-10-CM | POA: Diagnosis not present

## 2020-06-24 ENCOUNTER — Other Ambulatory Visit: Payer: Self-pay | Admitting: Cardiology

## 2020-06-24 ENCOUNTER — Other Ambulatory Visit: Payer: Self-pay | Admitting: Family

## 2020-06-24 NOTE — Telephone Encounter (Signed)
Metoprolol approved and sent 

## 2020-06-24 NOTE — Telephone Encounter (Signed)
Pt of Dr. Tomie China

## 2020-07-13 DIAGNOSIS — M7912 Myalgia of auxiliary muscles, head and neck: Secondary | ICD-10-CM | POA: Diagnosis not present

## 2020-07-13 DIAGNOSIS — M9901 Segmental and somatic dysfunction of cervical region: Secondary | ICD-10-CM | POA: Diagnosis not present

## 2020-07-13 DIAGNOSIS — M9902 Segmental and somatic dysfunction of thoracic region: Secondary | ICD-10-CM | POA: Diagnosis not present

## 2020-07-13 DIAGNOSIS — M546 Pain in thoracic spine: Secondary | ICD-10-CM | POA: Diagnosis not present

## 2020-07-13 DIAGNOSIS — M4323 Fusion of spine, cervicothoracic region: Secondary | ICD-10-CM | POA: Diagnosis not present

## 2020-07-22 DIAGNOSIS — H04123 Dry eye syndrome of bilateral lacrimal glands: Secondary | ICD-10-CM | POA: Diagnosis not present

## 2020-07-22 DIAGNOSIS — H02834 Dermatochalasis of left upper eyelid: Secondary | ICD-10-CM | POA: Diagnosis not present

## 2020-07-22 DIAGNOSIS — H02831 Dermatochalasis of right upper eyelid: Secondary | ICD-10-CM | POA: Diagnosis not present

## 2020-07-22 DIAGNOSIS — Z961 Presence of intraocular lens: Secondary | ICD-10-CM | POA: Diagnosis not present

## 2020-07-31 ENCOUNTER — Ambulatory Visit: Payer: Medicare PPO | Admitting: Cardiology

## 2020-08-03 DIAGNOSIS — M4323 Fusion of spine, cervicothoracic region: Secondary | ICD-10-CM | POA: Diagnosis not present

## 2020-08-03 DIAGNOSIS — M7912 Myalgia of auxiliary muscles, head and neck: Secondary | ICD-10-CM | POA: Diagnosis not present

## 2020-08-03 DIAGNOSIS — M9902 Segmental and somatic dysfunction of thoracic region: Secondary | ICD-10-CM | POA: Diagnosis not present

## 2020-08-03 DIAGNOSIS — M546 Pain in thoracic spine: Secondary | ICD-10-CM | POA: Diagnosis not present

## 2020-08-03 DIAGNOSIS — M9901 Segmental and somatic dysfunction of cervical region: Secondary | ICD-10-CM | POA: Diagnosis not present

## 2020-08-31 DIAGNOSIS — M9902 Segmental and somatic dysfunction of thoracic region: Secondary | ICD-10-CM | POA: Diagnosis not present

## 2020-08-31 DIAGNOSIS — M9901 Segmental and somatic dysfunction of cervical region: Secondary | ICD-10-CM | POA: Diagnosis not present

## 2020-08-31 DIAGNOSIS — M546 Pain in thoracic spine: Secondary | ICD-10-CM | POA: Diagnosis not present

## 2020-08-31 DIAGNOSIS — M4323 Fusion of spine, cervicothoracic region: Secondary | ICD-10-CM | POA: Diagnosis not present

## 2020-08-31 DIAGNOSIS — M7912 Myalgia of auxiliary muscles, head and neck: Secondary | ICD-10-CM | POA: Diagnosis not present

## 2020-09-10 ENCOUNTER — Ambulatory Visit: Payer: Medicare PPO | Admitting: Cardiology

## 2020-09-22 DIAGNOSIS — E1169 Type 2 diabetes mellitus with other specified complication: Secondary | ICD-10-CM | POA: Diagnosis not present

## 2020-09-22 DIAGNOSIS — H8113 Benign paroxysmal vertigo, bilateral: Secondary | ICD-10-CM | POA: Diagnosis not present

## 2020-09-22 DIAGNOSIS — F411 Generalized anxiety disorder: Secondary | ICD-10-CM | POA: Diagnosis not present

## 2020-09-22 DIAGNOSIS — E119 Type 2 diabetes mellitus without complications: Secondary | ICD-10-CM | POA: Diagnosis not present

## 2020-09-22 DIAGNOSIS — M810 Age-related osteoporosis without current pathological fracture: Secondary | ICD-10-CM | POA: Diagnosis not present

## 2020-09-22 DIAGNOSIS — E559 Vitamin D deficiency, unspecified: Secondary | ICD-10-CM | POA: Diagnosis not present

## 2020-09-22 DIAGNOSIS — E039 Hypothyroidism, unspecified: Secondary | ICD-10-CM | POA: Diagnosis not present

## 2020-09-22 DIAGNOSIS — E785 Hyperlipidemia, unspecified: Secondary | ICD-10-CM | POA: Diagnosis not present

## 2020-09-22 DIAGNOSIS — I491 Atrial premature depolarization: Secondary | ICD-10-CM | POA: Diagnosis not present

## 2020-09-22 DIAGNOSIS — M1990 Unspecified osteoarthritis, unspecified site: Secondary | ICD-10-CM | POA: Diagnosis not present

## 2020-09-25 ENCOUNTER — Other Ambulatory Visit: Payer: Self-pay

## 2020-09-25 DIAGNOSIS — M1990 Unspecified osteoarthritis, unspecified site: Secondary | ICD-10-CM | POA: Insufficient documentation

## 2020-09-25 DIAGNOSIS — R42 Dizziness and giddiness: Secondary | ICD-10-CM | POA: Insufficient documentation

## 2020-09-29 ENCOUNTER — Ambulatory Visit: Payer: Medicare PPO | Admitting: Cardiology

## 2020-09-29 ENCOUNTER — Other Ambulatory Visit: Payer: Self-pay | Admitting: Cardiology

## 2020-09-29 ENCOUNTER — Encounter: Payer: Self-pay | Admitting: Cardiology

## 2020-09-29 ENCOUNTER — Other Ambulatory Visit: Payer: Self-pay

## 2020-09-29 VITALS — BP 154/86 | HR 70 | Ht 69.0 in | Wt 178.8 lb

## 2020-09-29 DIAGNOSIS — E782 Mixed hyperlipidemia: Secondary | ICD-10-CM

## 2020-09-29 DIAGNOSIS — I10 Essential (primary) hypertension: Secondary | ICD-10-CM | POA: Diagnosis not present

## 2020-09-29 NOTE — Progress Notes (Signed)
Cardiology Office Note:    Date:  09/29/2020   ID:  Jessica [name], DOB [birth year], MRN [id number]  PCP:  Hurshel Party, NP  Cardiologist:  Garwin Brothers, MD   Referring MD: Hurshel Party, NP    ASSESSMENT:    1. Essential hypertension   2. Mixed hyperlipidemia    PLAN:    In order of problems listed above:  Primary prevention stressed with the patient.  Importance of compliance with diet medication stressed and she vocalized understanding. Essential hypertension: Blood pressure stable and diet was emphasized.  Her blood pressure readings at home are fine and she also tells me that blood pressure primary care provider also was also fine.  I told her to keep a track of her blood pressures at home and she promises to do so. Mixed dyslipidemia: On statin therapy.  Lipids reviewed.  Recent blood work reviewed and I discussed this with her at length and questions were answered to her satisfaction. Patient will be seen in follow-up appointment in 6 months or earlier if the patient has any concerns    Medication Adjustments/Labs and Tests Ordered: Current medicines are reviewed at length with the patient today.  Concerns regarding medicines are outlined above.  No orders of the defined types were placed in this encounter.  No orders of the defined types were placed in this encounter.    No chief complaint on file.    History of Present Illness:    Jessica [name] is a 80 y.o. female.  Patient has past medical history of essential hypertension and mixed dyslipidemia.  She denies any problems at this time and takes care of activities of daily living.  No chest pain orthopnea or PND.  She tells me that she walks about a mile a day on a daily basis without any symptoms.  This is in spite of her orthopedic issues.  At the time of my evaluation, the patient is alert awake oriented and in no distress.  Past Medical History:  Diagnosis Date   Arthritis    oa   Auditory vertigo 04/25/2019    Cervical spinal stenosis 12/13/2011   Cervical spondylosis 09/11/2019   Cervicalgia of occipito-atlanto-axial region 07/29/2019   Closed nondisplaced fracture of second cervical vertebra (HCC) 06/07/2017   Controlled type 2 diabetes mellitus with diabetic nephropathy, without long-term current use of insulin (HCC) 08/21/2017   Degenerative cervical disc 10/30/2019   Displacement of lumbar intervertebral disc without myelopathy 04/11/2013   Dizziness    related seeing vestibular md for inner ear   Epistaxis 10/07/2019   Essential hypertension 11/23/2018   Hyperlipidemia 08/21/2017   Hypothyroidism    Lumbar spondylosis 10/01/2013   Lumbosacral spondylosis without myelopathy 03/29/2013   Multiple falls 04/25/2019   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   Post-traumatic headache, not intractable 04/25/2019   S/P knee replacement 01/25/2016   Sensorineural hearing loss (SNHL) of left ear 04/25/2019   Spinal stenosis of lumbar region 04/11/2013   Syncope 08/21/2017   Tremor observed on examination 04/25/2019    Past Surgical History:  Procedure Laterality Date   ANTERIOR CERVICAL DECOMP/DISCECTOMY FUSION  12/13/2011   Procedure: ANTERIOR CERVICAL DECOMPRESSION/DISCECTOMY FUSION 3 LEVELS;  Surgeon: Temple Pacini, MD;  Location: [facility];  Service: Neurosurgery;  Laterality: N/A;  Cervical Five-Six Cervical Six-Seven Cervical Seven-Thoracic One Anterior cervical decompression/diskectomy/fusion/plating/allograft   BREAST BIOPSY Right 2000   CARPAL TUNNEL RELEASE     right   EYE SURGERY  cataract bilateral   FOOT ARTHRODESIS Left 06/25/2015   Procedure: HALLUX INTERPHALANGEAL JOINT  ARTHRODESIS;  Surgeon: Toni Arthurs, MD;  Location: [facility];  Service: Orthopedics;  Laterality: Left;   GASTROCNEMIUS RECESSION Left 06/25/2015   Procedure: LEFT GASTROC RECESSION ;  Surgeon: Toni Arthurs, MD;  Location: [facility];  Service: Orthopedics;  Laterality: Left;   HIP  FRACTURE SURGERY Left    TENDON REPAIR Left 06/25/2015   Procedure: LEFT ANTERIOR TIBIAL TENDON RECONSTRUCTION;  EXTENSOR HALLUCIS LONGUS TRANSFER ;  Surgeon: Toni Arthurs, MD;  Location: [facility];  Service: Orthopedics;  Laterality: Left;   TOTAL KNEE ARTHROPLASTY Right 01/25/2016   Procedure: RIGHT TOTAL KNEE ARTHROPLASTY;  Surgeon: Durene Romans, MD;  Location: [facility];  Service: Orthopedics;  Laterality: Right;   TUBAL LIGATION      Current Medications: Current Meds  Medication Sig   Calcium Carb-Cholecalciferol (CALCIUM 500+D PO) Take 1 tablet by mouth daily.   citalopram (CELEXA) 10 MG tablet Take 10 mg by mouth at bedtime.   ergocalciferol (VITAMIN D2) 50000 UNITS capsule Take 50,000 Units by mouth once a week. Due on 10/15    levothyroxine (SYNTHROID) 88 MCG tablet Take 88 mcg by mouth daily.   metoprolol succinate (TOPROL-XL) 25 MG 24 hr tablet TAKE 1 TABLET(25 MG) BY MOUTH DAILY   Multiple Vitamin (MULTIVITAMIN WITH MINERALS) TABS tablet Take 7 tablets by mouth daily. [facility] Multivitamin regimen   rosuvastatin (CRESTOR) 5 MG tablet Take 5 mg by mouth 3 (three) times a week.     Allergies:   Patient has no known allergies.   Social History   Socioeconomic History   Marital status: Widowed    Spouse name: Not on file   Number of children: Not on file   Years of education: Not on file   Highest education level: Not on file  Occupational History   Not on file  Tobacco Use   Smoking status: Never   Smokeless tobacco: Never  Vaping Use   Vaping Use: Never used  Substance and Sexual Activity   Alcohol use: Not Currently    Comment: rarely   Drug use: No   Sexual activity: Never  Other Topics Concern   Not on file  Social History Narrative   Not on file   Social Determinants of Health   Financial Resource Strain: Not on file  Food Insecurity: Not on file  Transportation Needs: Not on file  Physical Activity: Not on file  Stress: Not on file  Social  Connections: Not on file     Family History: The patient's family history includes Dementia in her mother; Osteoporosis in her mother; Other in her father; Prostate cancer in her maternal grandfather.  ROS:   Please see the history of present illness.    All other systems reviewed and are negative.  EKGs/Labs/Other Studies Reviewed:    The following studies were reviewed today: I discussed my findings with the patient at length EKG reveals sinus rhythm and nonspecific ST-T changes   Recent Labs: No results found for requested labs within last 8760 hours.  Recent Lipid Panel No results found for: CHOL, TRIG, HDL, CHOLHDL, VLDL, LDLCALC, LDLDIRECT  Physical Exam:    VS:  BP (!) 154/86   Pulse 70   Ht 5\' 9"  (1.753 m)   Wt 178 lb 12.8 oz (81.1 kg)   SpO2 98%   BMI 26.40 kg/m     Wt Readings from Last 3 Encounters:  09/29/20 178 lb 12.8 oz (81.1 kg)  10/30/19 172 lb (78 kg)  07/29/19 176 lb (79.8 kg)     GEN: Patient is in no acute distress HEENT: Normal NECK: No JVD; No carotid bruits LYMPHATICS: No lymphadenopathy CARDIAC: Hear sounds regular, 2/6 systolic murmur at the apex. RESPIRATORY:  Clear to auscultation without rales, wheezing or rhonchi  ABDOMEN: Soft, non-tender, non-distended MUSCULOSKELETAL:  No edema; No deformity  SKIN: Warm and dry NEUROLOGIC:  Alert and oriented x 3 PSYCHIATRIC:  Normal affect   Signed, Garwin Brothers, MD  09/29/2020 10:43 AM    [facility]

## 2020-09-29 NOTE — Patient Instructions (Signed)

## 2020-10-12 DIAGNOSIS — M546 Pain in thoracic spine: Secondary | ICD-10-CM | POA: Diagnosis not present

## 2020-10-12 DIAGNOSIS — M9901 Segmental and somatic dysfunction of cervical region: Secondary | ICD-10-CM | POA: Diagnosis not present

## 2020-10-12 DIAGNOSIS — M4323 Fusion of spine, cervicothoracic region: Secondary | ICD-10-CM | POA: Diagnosis not present

## 2020-10-12 DIAGNOSIS — M7912 Myalgia of auxiliary muscles, head and neck: Secondary | ICD-10-CM | POA: Diagnosis not present

## 2020-10-12 DIAGNOSIS — M9902 Segmental and somatic dysfunction of thoracic region: Secondary | ICD-10-CM | POA: Diagnosis not present

## 2020-10-28 NOTE — Patient Instructions (Signed)
Below is our plan:  We will continue to monitor symptoms. Please keep a close eye on your BP at home. If reading is greater than 150/90 when you get home please let Shannon Balthazar know.   Please make sure you are staying well hydrated. I recommend 50-60 ounces daily. Well balanced diet and regular exercise encouraged. Consistent sleep schedule with 6-8 hours recommended.   Please continue follow up with care team as directed.   Follow up with Dr Vickey Huger in 1 year   You may receive a survey regarding today's visit. I encourage you to leave honest feed back as I do use this information to improve patient care. Thank you for seeing me today!

## 2020-10-28 NOTE — Progress Notes (Signed)
Chief Complaint  Patient presents with   Follow-up    Rm 6, alone. Here for yearly f/u, overall pt iss doing well.     HISTORY OF PRESENT ILLNESS:  10/29/20 ALL:  Jessica [name] is a 80 y.o. female here today for follow up for previous concerns of vertigo. She was last seen 10/2019 and reported dizziness was resolved. Vestibular therapy was very helpful. BP had been fairly normal. Neck pain had resolved with traction and stretching with PT. Over the past year, She continues to do well. She is using a saline spray to help with nasal dryness and allergies. She reports that BP is usually 140's/80's. She is following with dentistry for lichen planus. She has cut back on citrus fruits and feels this may have helped. No other syncopal episodes.   HISTORY (copied from Dr Dohmeier's previous note)  Jessica [name] is a 80 year old Caucasian female patient seen here in a revisit  on 10/30/2019 . Until 2019 she had walked 3 miles 3 times a week. She was seen here since March 2021 and presented with vertigo at first- none since. Orthostatic dizziness has resolved.   She has less pain and neck tension as she underwent traction therapy, she has had a lot of massages and PT and her gait is much, much better. She feels less anxious and more trust in her own aiblity. She walked a mile and a half yesterday , with a 4 prong cane.    She had seen Dr. Dutch Quint in follow up after MRI results:  The brain MRI showed no abnormalities just some generalized cortical atrophy which is typical for the age not advanced, there were normal enhancement patterns chronic microvascular ischemic changes which are also age-related.  After infusion of contrast there was no abnormal enhancement.  No evidence of stroke normal pituitary gland.  As to her cervical spine she did have an area of severe by foraminal stenosis again this is not a spinal stenosis it is a stenosis of the exit holes of her cervical nerves between C3 and C4 and she is  now treating that with decompression therapy basically stretching the cervical spine this would not be a surgical intervention at all.  I was glad that Dr. Dutch Quint referred her for the appropriate physical therapy and she is doing doing so much better with this conservative approach.   08-19-2019 Had work up from cardiology and PCP.  she has undergone cardiac work up- reportedly her cardiologist was content with her results, and sees  in vestibular rehab. The patient kept a record of her vertigo spells in 2017 she had to, in 2018 for, none in 2019.  2020 she had 2 vertigo episodes but in 2021 between January and March 3, there were 23 vertigo episodes April 5, May 2021 and none in June. MRI brain was normal, metabolical blood work returned  all normal.  Dr. Dutch Quint has not seen her for her neck.   She has rigidity in the neck , head feels  Very heavy- some discomfort and interferes with daily mobility. Rosalita Chessman PT, Vest PT has helped greatly. She has still dizziness but not a true vertigo now.  today's also started with blood pressures on 29 July 2019 show a supine blood pressure of 158/74 and a heart rate of 63 regular.  Seated blood pressure of 168/82 and a heart rate of 66 regular and is standing blood pressure of 149/78 mmHg with a heart rate of 76.  This is a 20 point drop between seated and standing position.  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  She has a  has a past medical history of Arthritis, Dizziness/ Vertigo with spinning sensation ,  Syncope , and Hypothyroidism.Marland Kitchen  She was considered borderline DM and changed that with diet.  Today her spinning sensation occurred while on the commode to urinate.   She has been hydrating well, 3 liters a day.  She has orthostatic problems. She has a PT that come to the home, had dry needling for neck ROM.   Had several orthopedic surgeries.    Social history:   Patient is retired from middle school and [employer] and lives in a household with her youngest step son-, is widowed. Husband had  Parkinson's disease, was seen by [facility] neurology. She has 2 stepsons. Pets are present, a dog.Tobacco use: never .  ETOH use moderately in the past, none for years , Caffeine intake in form of Coffee( 1 cup in AM ) Soda( rare ) Tea ( none) or energy drinks..   Hobbies : reading.   Sleep habits are as follows: The patient's dinner time is between 5.30- 6  PM. The patient goes to bed at 10 PM and continues to sleep for  hours, wakes for 1-2 bathroom breaks.   The preferred sleep position is right side, with the support of  pillows.  Dreams are reportedly frequent/vivid.  6.30  AM is the usual rise time. The patient wakes up spontaneously.  She reports not feeling refreshed or restored in AM.   REVIEW OF SYSTEMS: Out of a complete 14 system review of symptoms, the patient complains only of the following symptoms, paraesthesias, vertigo and all other reviewed systems are negative.   ALLERGIES: No Known Allergies   HOME MEDICATIONS: Outpatient Medications Prior to Visit  Medication Sig Dispense Refill   Calcium Carb-Cholecalciferol (CALCIUM 500+D PO) Take 1 tablet by mouth daily.     citalopram (CELEXA) 10 MG tablet Take 10 mg by mouth at bedtime.     ergocalciferol (VITAMIN D2) 50000 UNITS capsule Take 50,000 Units by mouth as directed. 2 times a month-Friday     levothyroxine (SYNTHROID) 88 MCG tablet Take 88 mcg by mouth daily.     metoprolol succinate (TOPROL-XL) 25 MG 24 hr tablet TAKE 1 TABLET(25 MG) BY MOUTH DAILY 90 tablet 3   Multiple Vitamin (MULTIVITAMIN WITH MINERALS) TABS tablet Take 7 tablets by mouth daily. [place] Multivitamin regimen     rosuvastatin (CRESTOR) 5 MG tablet Take 5 mg by mouth 3 (three) times a week.     No facility-administered medications prior to visit.     PAST MEDICAL HISTORY: Past Medical History:  Diagnosis  Date   Arthritis    oa   Auditory vertigo 04/25/2019   Cervical spinal stenosis 12/13/2011   Cervical spondylosis 09/11/2019   Cervicalgia of occipito-atlanto-axial region 07/29/2019   Closed nondisplaced fracture of second cervical vertebra (HCC) 06/07/2017   Controlled type 2 diabetes mellitus with diabetic nephropathy, without long-term current use of insulin (HCC) 08/21/2017   Degenerative cervical disc 10/30/2019   Displacement of lumbar intervertebral disc without myelopathy 04/11/2013   Dizziness    related seeing vestibular md for inner ear   Epistaxis 10/07/2019   Essential hypertension 11/23/2018   Hyperlipidemia 08/21/2017   Hypothyroidism    Lumbar spondylosis 10/01/2013   Lumbosacral spondylosis without  myelopathy 03/29/2013   Multiple falls 04/25/2019   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   Post-traumatic headache, not intractable 04/25/2019   S/P knee replacement 01/25/2016   Sensorineural hearing loss (SNHL) of left ear 04/25/2019   Spinal stenosis of lumbar region 04/11/2013   Syncope 08/21/2017   Tremor observed on examination 04/25/2019     PAST SURGICAL HISTORY: Past Surgical History:  Procedure Laterality Date   ANTERIOR CERVICAL DECOMP/DISCECTOMY FUSION  12/13/2011   Procedure: ANTERIOR CERVICAL DECOMPRESSION/DISCECTOMY FUSION 3 LEVELS;  Surgeon: Temple PaciniHenry A Pool, MD;  Location: [facility];  Service: Neurosurgery;  Laterality: N/A;  Cervical Five-Six Cervical Six-Seven Cervical Seven-Thoracic One Anterior cervical decompression/diskectomy/fusion/plating/allograft   BREAST BIOPSY Right 2000   CARPAL TUNNEL RELEASE     right   EYE SURGERY     cataract bilateral   FOOT ARTHRODESIS Left 06/25/2015   Procedure: HALLUX INTERPHALANGEAL JOINT  ARTHRODESIS;  Surgeon: Toni ArthursJohn Hewitt, MD;  Location: [facility];  Service: Orthopedics;  Laterality: Left;   GASTROCNEMIUS RECESSION Left 06/25/2015   Procedure: LEFT GASTROC RECESSION ;  Surgeon: Toni ArthursJohn Hewitt, MD;  Location:  [facility];  Service: Orthopedics;  Laterality: Left;   HIP FRACTURE SURGERY Left    TENDON REPAIR Left 06/25/2015   Procedure: LEFT ANTERIOR TIBIAL TENDON RECONSTRUCTION;  EXTENSOR HALLUCIS LONGUS TRANSFER ;  Surgeon: Toni ArthursJohn Hewitt, MD;  Location: [facility];  Service: Orthopedics;  Laterality: Left;   TOTAL KNEE ARTHROPLASTY Right 01/25/2016   Procedure: RIGHT TOTAL KNEE ARTHROPLASTY;  Surgeon: Durene RomansMatthew Olin, MD;  Location: [facility];  Service: Orthopedics;  Laterality: Right;   TUBAL LIGATION       FAMILY HISTORY: Family History  Problem Relation Age of Onset   Dementia Mother    Osteoporosis Mother    Other Father        Senescence   Prostate cancer Maternal Grandfather      SOCIAL HISTORY: Social History   Socioeconomic History   Marital status: Widowed    Spouse name: Not on file   Number of children: Not on file   Years of education: Not on file   Highest education level: Not on file  Occupational History   Not on file  Tobacco Use   Smoking status: Never   Smokeless tobacco: Never  Vaping Use   Vaping Use: Never used  Substance and Sexual Activity   Alcohol use: Not Currently    Comment: rarely   Drug use: No   Sexual activity: Never  Other Topics Concern   Not on file  Social History Narrative   Not on file   Social Determinants of Health   Financial Resource Strain: Not on file  Food Insecurity: Not on file  Transportation Needs: Not on file  Physical Activity: Not on file  Stress: Not on file  Social Connections: Not on file  Intimate Partner Violence: Not on file     PHYSICAL EXAM  Vitals:   10/29/20 1035  BP: (!) 185/86  Pulse: (!) 59  Weight: 176 lb (79.8 kg)  Height: 5\' 8"  (1.727 m)   Body mass index is 26.76 kg/m.   Generalized: Well developed, in no acute distress  Cardiology: normal rate and rhythm, no murmur auscultated  Respiratory: clear to auscultation bilaterally    Neurological examination   Mentation: Alert oriented to time, place, history taking. Follows all commands speech and language fluent Cranial nerve II-XII: Pupils were equal round reactive to light. Extraocular movements were full, visual field  were full on confrontational test. Facial sensation and strength were normal. Head turning and shoulder shrug  were normal and symmetric. Motor: The motor testing reveals 5 over 5 strength of all 4 extremities. Good symmetric motor tone is noted throughout.  Sensory: Sensory testing is intact to soft touch on all 4 extremities. No evidence of extinction is noted.  Coordination: Cerebellar testing reveals good finger-nose-finger and heel-to-shin bilaterally.  Gait and station: Gait is cautious but stable with four prong cane     DIAGNOSTIC DATA (LABS, IMAGING, TESTING) - I reviewed patient records, labs, notes, testing and imaging myself where available.  Lab Results  Component Value Date   WBC 8.9 08/21/2017   HGB 11.9 08/21/2017   HCT 36.7 08/21/2017   MCV 92 08/21/2017   PLT 399 08/21/2017      Component Value Date/Time   NA 135 04/25/2019 1652   K 4.5 04/25/2019 1652   CL 96 04/25/2019 1652   CO2 22 04/25/2019 1652   GLUCOSE 103 (H) 04/25/2019 1652   GLUCOSE 112 (H) 01/26/2016 0506   BUN 20 04/25/2019 1652   CREATININE 0.89 04/25/2019 1652   CALCIUM 10.4 (H) 04/25/2019 1652   PROT 7.8 04/25/2019 1652   ALBUMIN 4.7 04/25/2019 1652   AST 24 04/25/2019 1652   ALT 29 04/25/2019 1652   ALKPHOS 65 04/25/2019 1652   BILITOT 0.3 04/25/2019 1652   GFRNONAA 61 04/25/2019 1652   GFRAA 71 04/25/2019 1652   No results found for: CHOL, HDL, LDLCALC, LDLDIRECT, TRIG, CHOLHDL No results found for: YFVC9S No results found for: VITAMINB12 Lab Results  Component Value Date   TSH 1.420 08/21/2017    No flowsheet data found.   No flowsheet data found.   ASSESSMENT AND PLAN  80 y.o. year old female  has a past medical history of Arthritis, Auditory vertigo  (04/25/2019), Cervical spinal stenosis (12/13/2011), Cervical spondylosis (09/11/2019), Cervicalgia of occipito-atlanto-axial region (07/29/2019), Closed nondisplaced fracture of second cervical vertebra (HCC) (06/07/2017), Controlled type 2 diabetes mellitus with diabetic nephropathy, without long-term current use of insulin (HCC) (08/21/2017), Degenerative cervical disc (10/30/2019), Displacement of lumbar intervertebral disc without myelopathy (04/11/2013), Dizziness, Epistaxis (10/07/2019), Essential hypertension (11/23/2018), Hyperlipidemia (08/21/2017), Hypothyroidism, Lumbar spondylosis (10/01/2013), Lumbosacral spondylosis without myelopathy (03/29/2013), Multiple falls (04/25/2019), Orthostatic hypotension dysautonomic syndrome (04/25/2019), Post-traumatic headache, not intractable (04/25/2019), S/P knee replacement (01/25/2016), Sensorineural hearing loss (SNHL) of left ear (04/25/2019), Spinal stenosis of lumbar region (04/11/2013), Syncope (08/21/2017), and Tremor observed on examination (04/25/2019). here with    Degenerative cervical disc  Chenae is doing well, today. She denies recent concerns of vertigo or syncope. She continues to follow closely with cardiology, PCP and dentistry. We will continue to monitor symptoms. Tongue tenderness is better with dietary changes. BP is elevated in the office, today. Fortunately, she is asymptomatic. She reports usual readings are 140's/80's. She will monitor closely at home. She was given strict follow up precautions and instructed to call PCP if reading dow not normalize at home. Healthy lifestyle habits encouraged. She will follow up in 1 year.    No orders of the defined types were placed in this encounter.    No orders of the defined types were placed in this encounter.     Shawnie Dapper, MSN, FNP-C 10/29/2020, 11:16 AM  [facility] [address], [address] [place], [place] [address] [phone]

## 2020-10-29 ENCOUNTER — Encounter: Payer: Self-pay | Admitting: Family Medicine

## 2020-10-29 ENCOUNTER — Ambulatory Visit: Payer: Medicare PPO | Admitting: Family Medicine

## 2020-10-29 ENCOUNTER — Other Ambulatory Visit: Payer: Self-pay

## 2020-10-29 VITALS — BP 185/86 | HR 59 | Ht 68.0 in | Wt 176.0 lb

## 2020-10-29 DIAGNOSIS — M5030 Other cervical disc degeneration, unspecified cervical region: Secondary | ICD-10-CM

## 2020-12-01 ENCOUNTER — Other Ambulatory Visit: Payer: Self-pay | Admitting: Cardiology

## 2020-12-01 DIAGNOSIS — M810 Age-related osteoporosis without current pathological fracture: Secondary | ICD-10-CM | POA: Diagnosis not present

## 2020-12-07 DIAGNOSIS — M7912 Myalgia of auxiliary muscles, head and neck: Secondary | ICD-10-CM | POA: Diagnosis not present

## 2020-12-07 DIAGNOSIS — M9901 Segmental and somatic dysfunction of cervical region: Secondary | ICD-10-CM | POA: Diagnosis not present

## 2020-12-07 DIAGNOSIS — M546 Pain in thoracic spine: Secondary | ICD-10-CM | POA: Diagnosis not present

## 2020-12-07 DIAGNOSIS — M9902 Segmental and somatic dysfunction of thoracic region: Secondary | ICD-10-CM | POA: Diagnosis not present

## 2020-12-07 DIAGNOSIS — M4323 Fusion of spine, cervicothoracic region: Secondary | ICD-10-CM | POA: Diagnosis not present

## 2020-12-28 DIAGNOSIS — M810 Age-related osteoporosis without current pathological fracture: Secondary | ICD-10-CM | POA: Diagnosis not present

## 2020-12-28 DIAGNOSIS — Z6826 Body mass index (BMI) 26.0-26.9, adult: Secondary | ICD-10-CM | POA: Diagnosis not present

## 2020-12-28 DIAGNOSIS — F411 Generalized anxiety disorder: Secondary | ICD-10-CM | POA: Diagnosis not present

## 2020-12-28 DIAGNOSIS — E559 Vitamin D deficiency, unspecified: Secondary | ICD-10-CM | POA: Diagnosis not present

## 2020-12-28 DIAGNOSIS — H8113 Benign paroxysmal vertigo, bilateral: Secondary | ICD-10-CM | POA: Diagnosis not present

## 2020-12-28 DIAGNOSIS — E039 Hypothyroidism, unspecified: Secondary | ICD-10-CM | POA: Diagnosis not present

## 2020-12-28 DIAGNOSIS — E1169 Type 2 diabetes mellitus with other specified complication: Secondary | ICD-10-CM | POA: Diagnosis not present

## 2020-12-28 DIAGNOSIS — E785 Hyperlipidemia, unspecified: Secondary | ICD-10-CM | POA: Diagnosis not present

## 2020-12-28 DIAGNOSIS — I491 Atrial premature depolarization: Secondary | ICD-10-CM | POA: Diagnosis not present

## 2020-12-28 DIAGNOSIS — R252 Cramp and spasm: Secondary | ICD-10-CM | POA: Diagnosis not present

## 2020-12-28 DIAGNOSIS — M1990 Unspecified osteoarthritis, unspecified site: Secondary | ICD-10-CM | POA: Diagnosis not present

## 2021-01-04 DIAGNOSIS — M9901 Segmental and somatic dysfunction of cervical region: Secondary | ICD-10-CM | POA: Diagnosis not present

## 2021-01-04 DIAGNOSIS — M4323 Fusion of spine, cervicothoracic region: Secondary | ICD-10-CM | POA: Diagnosis not present

## 2021-01-04 DIAGNOSIS — M7912 Myalgia of auxiliary muscles, head and neck: Secondary | ICD-10-CM | POA: Diagnosis not present

## 2021-01-04 DIAGNOSIS — M546 Pain in thoracic spine: Secondary | ICD-10-CM | POA: Diagnosis not present

## 2021-01-04 DIAGNOSIS — M9902 Segmental and somatic dysfunction of thoracic region: Secondary | ICD-10-CM | POA: Diagnosis not present

## 2021-01-27 DIAGNOSIS — M1712 Unilateral primary osteoarthritis, left knee: Secondary | ICD-10-CM | POA: Diagnosis not present

## 2021-01-27 DIAGNOSIS — M25562 Pain in left knee: Secondary | ICD-10-CM | POA: Diagnosis not present

## 2021-02-08 DIAGNOSIS — M9902 Segmental and somatic dysfunction of thoracic region: Secondary | ICD-10-CM | POA: Diagnosis not present

## 2021-02-08 DIAGNOSIS — M7912 Myalgia of auxiliary muscles, head and neck: Secondary | ICD-10-CM | POA: Diagnosis not present

## 2021-02-08 DIAGNOSIS — M4323 Fusion of spine, cervicothoracic region: Secondary | ICD-10-CM | POA: Diagnosis not present

## 2021-02-08 DIAGNOSIS — M9901 Segmental and somatic dysfunction of cervical region: Secondary | ICD-10-CM | POA: Diagnosis not present

## 2021-02-08 DIAGNOSIS — M546 Pain in thoracic spine: Secondary | ICD-10-CM | POA: Diagnosis not present

## 2021-03-15 DIAGNOSIS — M546 Pain in thoracic spine: Secondary | ICD-10-CM | POA: Diagnosis not present

## 2021-03-15 DIAGNOSIS — M7912 Myalgia of auxiliary muscles, head and neck: Secondary | ICD-10-CM | POA: Diagnosis not present

## 2021-03-15 DIAGNOSIS — M9901 Segmental and somatic dysfunction of cervical region: Secondary | ICD-10-CM | POA: Diagnosis not present

## 2021-03-15 DIAGNOSIS — M4323 Fusion of spine, cervicothoracic region: Secondary | ICD-10-CM | POA: Diagnosis not present

## 2021-03-15 DIAGNOSIS — M9902 Segmental and somatic dysfunction of thoracic region: Secondary | ICD-10-CM | POA: Diagnosis not present

## 2021-03-29 DIAGNOSIS — E039 Hypothyroidism, unspecified: Secondary | ICD-10-CM | POA: Diagnosis not present

## 2021-03-29 DIAGNOSIS — F411 Generalized anxiety disorder: Secondary | ICD-10-CM | POA: Diagnosis not present

## 2021-03-29 DIAGNOSIS — M1990 Unspecified osteoarthritis, unspecified site: Secondary | ICD-10-CM | POA: Diagnosis not present

## 2021-03-29 DIAGNOSIS — E1169 Type 2 diabetes mellitus with other specified complication: Secondary | ICD-10-CM | POA: Diagnosis not present

## 2021-03-29 DIAGNOSIS — E785 Hyperlipidemia, unspecified: Secondary | ICD-10-CM | POA: Diagnosis not present

## 2021-03-29 DIAGNOSIS — E559 Vitamin D deficiency, unspecified: Secondary | ICD-10-CM | POA: Diagnosis not present

## 2021-03-29 DIAGNOSIS — H8113 Benign paroxysmal vertigo, bilateral: Secondary | ICD-10-CM | POA: Diagnosis not present

## 2021-03-29 DIAGNOSIS — I491 Atrial premature depolarization: Secondary | ICD-10-CM | POA: Diagnosis not present

## 2021-03-29 DIAGNOSIS — I10 Essential (primary) hypertension: Secondary | ICD-10-CM | POA: Diagnosis not present

## 2021-04-07 ENCOUNTER — Ambulatory Visit: Payer: Medicare PPO | Admitting: Cardiology

## 2021-04-19 ENCOUNTER — Other Ambulatory Visit: Payer: Self-pay | Admitting: Internal Medicine

## 2021-04-19 DIAGNOSIS — M4323 Fusion of spine, cervicothoracic region: Secondary | ICD-10-CM | POA: Diagnosis not present

## 2021-04-19 DIAGNOSIS — M9901 Segmental and somatic dysfunction of cervical region: Secondary | ICD-10-CM | POA: Diagnosis not present

## 2021-04-19 DIAGNOSIS — M7912 Myalgia of auxiliary muscles, head and neck: Secondary | ICD-10-CM | POA: Diagnosis not present

## 2021-04-19 DIAGNOSIS — Z1231 Encounter for screening mammogram for malignant neoplasm of breast: Secondary | ICD-10-CM

## 2021-04-19 DIAGNOSIS — M9902 Segmental and somatic dysfunction of thoracic region: Secondary | ICD-10-CM | POA: Diagnosis not present

## 2021-04-19 DIAGNOSIS — M546 Pain in thoracic spine: Secondary | ICD-10-CM | POA: Diagnosis not present

## 2021-04-21 DIAGNOSIS — Z01110 Encounter for hearing examination following failed hearing screening: Secondary | ICD-10-CM | POA: Diagnosis not present

## 2021-05-17 ENCOUNTER — Ambulatory Visit
Admission: RE | Admit: 2021-05-17 | Discharge: 2021-05-17 | Disposition: A | Discharge status: Home / Self Care | Payer: Medicare PPO | Source: Ambulatory Visit | Attending: Internal Medicine | Admitting: Internal Medicine

## 2021-05-17 ENCOUNTER — Other Ambulatory Visit: Payer: Self-pay

## 2021-05-17 DIAGNOSIS — Z1231 Encounter for screening mammogram for malignant neoplasm of breast: Secondary | ICD-10-CM | POA: Diagnosis not present

## 2021-06-07 DIAGNOSIS — M9901 Segmental and somatic dysfunction of cervical region: Secondary | ICD-10-CM | POA: Diagnosis not present

## 2021-06-07 DIAGNOSIS — M4323 Fusion of spine, cervicothoracic region: Secondary | ICD-10-CM | POA: Diagnosis not present

## 2021-06-07 DIAGNOSIS — M9902 Segmental and somatic dysfunction of thoracic region: Secondary | ICD-10-CM | POA: Diagnosis not present

## 2021-06-17 DIAGNOSIS — M810 Age-related osteoporosis without current pathological fracture: Secondary | ICD-10-CM | POA: Diagnosis not present

## 2021-06-21 ENCOUNTER — Encounter: Payer: Self-pay | Admitting: Cardiology

## 2021-06-21 ENCOUNTER — Ambulatory Visit: Payer: Medicare PPO | Admitting: Cardiology

## 2021-06-21 VITALS — BP 160/72 | HR 64 | Ht 68.0 in | Wt 178.6 lb

## 2021-06-21 DIAGNOSIS — I10 Essential (primary) hypertension: Secondary | ICD-10-CM

## 2021-06-21 DIAGNOSIS — E782 Mixed hyperlipidemia: Secondary | ICD-10-CM

## 2021-06-21 NOTE — Progress Notes (Signed)
?Cardiology Office Note:   ? ?Date:  06/21/2021  ? ?ID:  EMMERSEN [name], DOB 14-Apr-1940, MRN [id number] ? ?PCP:  Hurshel Party, NP  ?Cardiologist:  Garwin Brothers, MD  ? ?Referring MD: Hurshel Party, NP  ? ? ?ASSESSMENT:   ? ?1. Essential hypertension   ?2. Mixed hyperlipidemia   ? ?PLAN:   ? ?In order of problems listed above: ? ?Primary prevention stressed to the patient.  Importance of compliance with diet medication stressed any vocalized understanding.  She is happy exercising on a regular basis and enjoys it. ?Essential hypertension: Blood pressure stable and diet was emphasized.  She has an element of whitecoat hypertension.  She has brought multiple blood pressure readings from home and they are fine.  She is worried about hypotension and falls and consequences and I think it is best to except a little elevated blood pressure then because of her to have gait instability and falls with hypotension.  Especially because she has had orthostatic hypotension issues in the past.  Overall can be devastating for her. ?Mixed dyslipidemia: Diet was emphasized.  Lipids were reviewed and I congratulated her about this. ?Patient will be seen in follow-up appointment in 12 months or earlier if the patient has any concerns ? ? ? ?Medication Adjustments/Labs and Tests Ordered: ?Current medicines are reviewed at length with the patient today.  Concerns regarding medicines are outlined above.  ?No orders of the defined types were placed in this encounter. ? ?No orders of the defined types were placed in this encounter. ? ? ? ?No chief complaint on file. ?  ? ?History of Present Illness:   ? ?Jessica [name] is a 81 y.o. female.  Patient has past medical history of essential hypertension and dyslipidemia.  She denies any problems at this time and takes care of activities of daily living.  She keeps herself active exercising and walking and using her exercise bicycle on a regular basis.  With this she has no symptoms.  At the time of my  evaluation, the patient is alert awake oriented and in no distress. ? ?Past Medical History:  ?Diagnosis Date  ? Arthritis   ? oa  ? Auditory vertigo 04/25/2019  ? Cervical spinal stenosis 12/13/2011  ? Cervical spondylosis 09/11/2019  ? Cervicalgia of occipito-atlanto-axial region 07/29/2019  ? Closed nondisplaced fracture of second cervical vertebra (HCC) 06/07/2017  ? Controlled type 2 diabetes mellitus with diabetic nephropathy, without long-term current use of insulin (HCC) 08/21/2017  ? Degenerative cervical disc 10/30/2019  ? Displacement of lumbar intervertebral disc without myelopathy 04/11/2013  ? Dizziness   ? related seeing vestibular md for inner ear  ? Epistaxis 10/07/2019  ? Essential hypertension 11/23/2018  ? Hyperlipidemia 08/21/2017  ? Hypothyroidism   ? Lumbar spondylosis 10/01/2013  ? Lumbosacral spondylosis without myelopathy 03/29/2013  ? Multiple falls 04/25/2019  ? 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 Post-traumatic headache, not intractable 04/25/2019  ? S/P knee replacement 01/25/2016  ? 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 Spinal stenosis of lumbar region 04/11/2013  ? Syncope 08/21/2017  ? Tremor observed on examination 04/25/2019  ? ? ?Past Surgical History:  ?Procedure Laterality Date  ? ANTERIOR CERVICAL DECOMP/DISCECTOMY FUSION  12/13/2011  ? Procedure: ANTERIOR CERVICAL DECOMPRESSION/DISCECTOMY FUSION 3 LEVELS;  Surgeon: Temple Pacini, MD;  Location: [facility];  Service: Neurosurgery;  Laterality: N/A;  Cervical Five-Six Cervical Six-Seven Cervical Seven-Thoracic One Anterior cervical decompression/diskectomy/fusion/plating/allograft  ? BREAST BIOPSY Right  04/29/1998  ? BREAST BIOPSY Right 05/02/2019  ? CARPAL TUNNEL RELEASE    ? right  ? EYE SURGERY    ? cataract bilateral  ? FOOT ARTHRODESIS Left 06/25/2015  ? Procedure: HALLUX INTERPHALANGEAL JOINT  ARTHRODESIS;  Surgeon: Toni Arthurs, MD;  Location: [facility];  Service: Orthopedics;  Laterality:  Left;  ? GASTROCNEMIUS RECESSION Left 06/25/2015  ? Procedure: LEFT GASTROC RECESSION ;  Surgeon: Toni Arthurs, MD;  Location: [facility];  Service: Orthopedics;  Laterality: Left;  ? HIP FRACTURE SURGERY Left   ? TENDON REPAIR Left 06/25/2015  ? Procedure: LEFT ANTERIOR TIBIAL TENDON RECONSTRUCTION;  EXTENSOR HALLUCIS LONGUS TRANSFER ;  Surgeon: Toni Arthurs, MD;  Location:  SURGERY CENTER;  Service: Orthopedics;  Laterality: Left;  ? TOTAL KNEE ARTHROPLASTY Right 01/25/2016  ? Procedure: RIGHT TOTAL KNEE ARTHROPLASTY;  Surgeon: Durene Romans, MD;  Location: [facility];  Service: Orthopedics;  Laterality: Right;  ? TUBAL LIGATION    ? ? ?Current Medications: ?Current Meds  ?Medication Sig  ? Calcium Carb-Cholecalciferol (CALCIUM 500+D PO) Take 1 tablet by mouth daily.  ? citalopram (CELEXA) 10 MG tablet Take 10 mg by mouth at bedtime.  ? ergocalciferol (VITAMIN D2) 50000 UNITS capsule Take 50,000 Units by mouth as directed. 2 times a month-Friday  ? levothyroxine (SYNTHROID) 88 MCG tablet Take 88 mcg by mouth daily.  ? metoprolol succinate (TOPROL-XL) 25 MG 24 hr tablet TAKE 1 TABLET(25 MG) BY MOUTH DAILY  ? Multiple Vitamin (MULTIVITAMIN WITH MINERALS) TABS tablet Take 7 tablets by mouth daily. [place] Multivitamin regimen  ? rosuvastatin (CRESTOR) 5 MG tablet Take 5 mg by mouth 3 (three) times a week.  ?  ? ?Allergies:   Patient has no known allergies.  ? ?Social History  ? ?Socioeconomic History  ? Marital status: Widowed  ?  Spouse name: Not on file  ? Number of children: Not on file  ? Years of education: Not on file  ? Highest education level: Not on file  ?Occupational History  ? Not on file  ?Tobacco Use  ? Smoking status: Never  ? Smokeless tobacco: Never  ?Vaping Use  ? Vaping Use: Never used  ?Substance and Sexual Activity  ? Alcohol use: Not Currently  ?  Comment: rarely  ? Drug use: No  ? Sexual activity: Never  ?Other Topics Concern  ? Not on file  ?Social History Narrative  ? Not  on file  ? ?Social Determinants of Health  ? ?Financial Resource Strain: Not on file  ?Food Insecurity: Not on file  ?Transportation Needs: Not on file  ?Physical Activity: Not on file  ?Stress: Not on file  ?Social Connections: Not on file  ?  ? ?Family History: ?The patient's family history includes Dementia in her mother; Osteoporosis in her mother; Other in her father; Prostate cancer in her maternal grandfather. ? ?ROS:   ?Please see the history of present illness.    ?All other systems reviewed and are negative. ? ?EKGs/Labs/Other Studies Reviewed:   ? ?The following studies were reviewed today: ?I discussed my findings with the patient at length. ? ? ?Recent Labs: ?No results found for requested labs within last 8760 hours.  ?Recent Lipid Panel ?No results found for: CHOL, TRIG, HDL, CHOLHDL, VLDL, LDLCALC, LDLDIRECT ? ?Physical Exam:   ? ?VS:  BP (!) 160/72   Pulse 64   Ht 5\' 8"  (1.727 m)   Wt 178 lb 9.6 oz (81 kg)   SpO2 96%  BMI 27.16 kg/m?    ? ?Wt Readings from Last 3 Encounters:  ?06/21/21 178 lb 9.6 oz (81 kg)  ?10/29/20 176 lb (79.8 kg)  ?09/29/20 178 lb 12.8 oz (81.1 kg)  ?  ? ?GEN: Patient is in no acute distress ?HEENT: Normal ?NECK: No JVD; No carotid bruits ?LYMPHATICS: No lymphadenopathy ?CARDIAC: Hear sounds regular, 2/6 systolic murmur at the apex. ?RESPIRATORY:  Clear to auscultation without rales, wheezing or rhonchi  ?ABDOMEN: Soft, non-tender, non-distended ?MUSCULOSKELETAL:  No edema; No deformity  ?SKIN: Warm and dry ?NEUROLOGIC:  Alert and oriented x 3 ?PSYCHIATRIC:  Normal affect  ? ?Signed, ?Garwin Brothersajan R Isma Tietje, MD  ?06/21/2021 10:07 AM    ?[facility]  ?

## 2021-06-21 NOTE — Patient Instructions (Signed)
Medication Instructions:  Your physician recommends that you continue on your current medications as directed. Please refer to the Current Medication list given to you today.  *If you need a refill on your cardiac medications before your next appointment, please call your pharmacy*   Lab Work: NONE If you have labs (blood work) drawn today and your tests are completely normal, you will receive your results only by: MyChart Message (if you have MyChart) OR A paper copy in the mail If you have any lab test that is abnormal or we need to change your treatment, we will call you to review the results.   Testing/Procedures: NONE   Follow-Up: At [facility], you and your health needs are our priority.  As part of our continuing mission to provide you with exceptional heart care, we have created designated Provider Care Teams.  These Care Teams include your primary Cardiologist (physician) and Advanced Practice Providers (APPs -  Physician Assistants and Nurse Practitioners) who all work together to provide you with the care you need, when you need it.  We recommend signing up for the patient portal called "MyChart".  Sign up information is provided on this After Visit Summary.  MyChart is used to connect with patients for Virtual Visits (Telemedicine).  Patients are able to view lab/test results, encounter notes, upcoming appointments, etc.  Non-urgent messages can be sent to your provider as well.   To learn more about what you can do with MyChart, go to [email or web address].    Your next appointment:   1 year(s)  The format for your next appointment:   In Person  Provider:   Rajan Revankar, MD    Other Instructions   Important Information About Sugar       

## 2021-06-29 DIAGNOSIS — E1169 Type 2 diabetes mellitus with other specified complication: Secondary | ICD-10-CM | POA: Diagnosis not present

## 2021-06-29 DIAGNOSIS — I491 Atrial premature depolarization: Secondary | ICD-10-CM | POA: Diagnosis not present

## 2021-06-29 DIAGNOSIS — E559 Vitamin D deficiency, unspecified: Secondary | ICD-10-CM | POA: Diagnosis not present

## 2021-06-29 DIAGNOSIS — E785 Hyperlipidemia, unspecified: Secondary | ICD-10-CM | POA: Diagnosis not present

## 2021-06-29 DIAGNOSIS — I10 Essential (primary) hypertension: Secondary | ICD-10-CM | POA: Diagnosis not present

## 2021-06-29 DIAGNOSIS — E039 Hypothyroidism, unspecified: Secondary | ICD-10-CM | POA: Diagnosis not present

## 2021-06-29 DIAGNOSIS — H8113 Benign paroxysmal vertigo, bilateral: Secondary | ICD-10-CM | POA: Diagnosis not present

## 2021-06-29 DIAGNOSIS — F411 Generalized anxiety disorder: Secondary | ICD-10-CM | POA: Diagnosis not present

## 2021-06-29 DIAGNOSIS — M1990 Unspecified osteoarthritis, unspecified site: Secondary | ICD-10-CM | POA: Diagnosis not present

## 2021-07-12 DIAGNOSIS — M4323 Fusion of spine, cervicothoracic region: Secondary | ICD-10-CM | POA: Diagnosis not present

## 2021-07-12 DIAGNOSIS — M9901 Segmental and somatic dysfunction of cervical region: Secondary | ICD-10-CM | POA: Diagnosis not present

## 2021-07-12 DIAGNOSIS — M9902 Segmental and somatic dysfunction of thoracic region: Secondary | ICD-10-CM | POA: Diagnosis not present

## 2021-07-23 DIAGNOSIS — Z1331 Encounter for screening for depression: Secondary | ICD-10-CM | POA: Diagnosis not present

## 2021-07-23 DIAGNOSIS — Z9181 History of falling: Secondary | ICD-10-CM | POA: Diagnosis not present

## 2021-07-23 DIAGNOSIS — E785 Hyperlipidemia, unspecified: Secondary | ICD-10-CM | POA: Diagnosis not present

## 2021-07-23 DIAGNOSIS — Z0000 Encounter for general adult medical examination without abnormal findings: Secondary | ICD-10-CM | POA: Diagnosis not present

## 2021-07-23 DIAGNOSIS — Z139 Encounter for screening, unspecified: Secondary | ICD-10-CM | POA: Diagnosis not present

## 2021-07-26 DIAGNOSIS — H02834 Dermatochalasis of left upper eyelid: Secondary | ICD-10-CM | POA: Diagnosis not present

## 2021-07-26 DIAGNOSIS — R7303 Prediabetes: Secondary | ICD-10-CM | POA: Diagnosis not present

## 2021-07-26 DIAGNOSIS — H04123 Dry eye syndrome of bilateral lacrimal glands: Secondary | ICD-10-CM | POA: Diagnosis not present

## 2021-07-26 DIAGNOSIS — Z961 Presence of intraocular lens: Secondary | ICD-10-CM | POA: Diagnosis not present

## 2021-08-16 DIAGNOSIS — M9901 Segmental and somatic dysfunction of cervical region: Secondary | ICD-10-CM | POA: Diagnosis not present

## 2021-08-16 DIAGNOSIS — M9902 Segmental and somatic dysfunction of thoracic region: Secondary | ICD-10-CM | POA: Diagnosis not present

## 2021-08-16 DIAGNOSIS — M4323 Fusion of spine, cervicothoracic region: Secondary | ICD-10-CM | POA: Diagnosis not present

## 2021-09-13 DIAGNOSIS — M9902 Segmental and somatic dysfunction of thoracic region: Secondary | ICD-10-CM | POA: Diagnosis not present

## 2021-09-13 DIAGNOSIS — M9901 Segmental and somatic dysfunction of cervical region: Secondary | ICD-10-CM | POA: Diagnosis not present

## 2021-09-13 DIAGNOSIS — M4323 Fusion of spine, cervicothoracic region: Secondary | ICD-10-CM | POA: Diagnosis not present

## 2021-10-09 IMAGING — MG MM DIGITAL SCREENING BILAT W/ TOMO AND CAD
8 series · 8 of 24 positions shown · non-contrast
Comparison: Previous exam(s).

CLINICAL DATA: Screening.

EXAM:
DIGITAL SCREENING BILATERAL MAMMOGRAM WITH TOMOSYNTHESIS AND CAD
TECHNIQUE: Bilateral screening digital craniocaudal and mediolateral oblique
mammograms were obtained. Bilateral screening digital breast
tomosynthesis was performed. The images were evaluated with
computer-aided detection.

[R CC synth-2D]
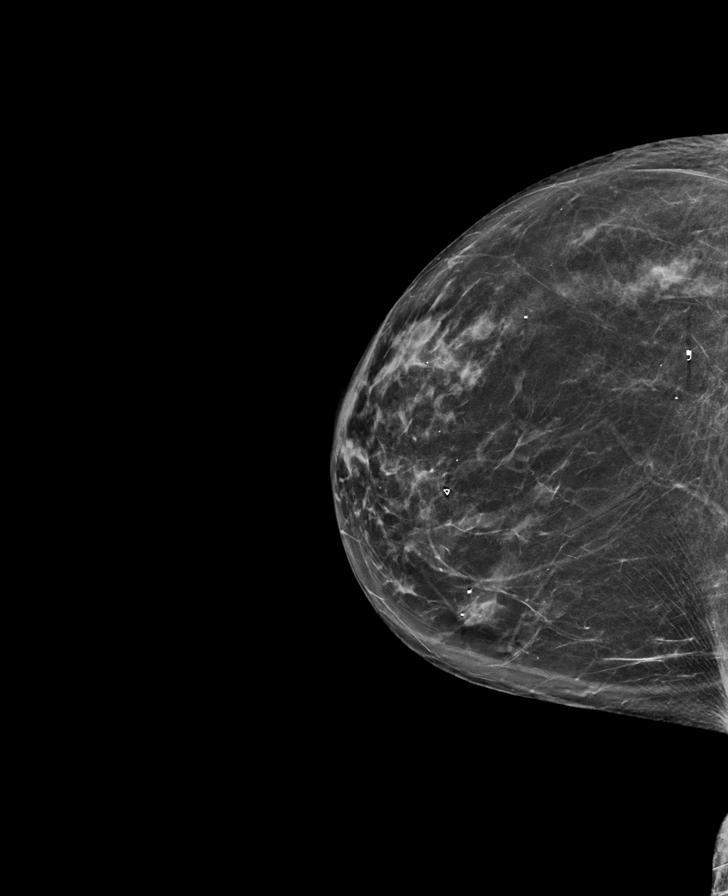

[R MLO synth-2D]
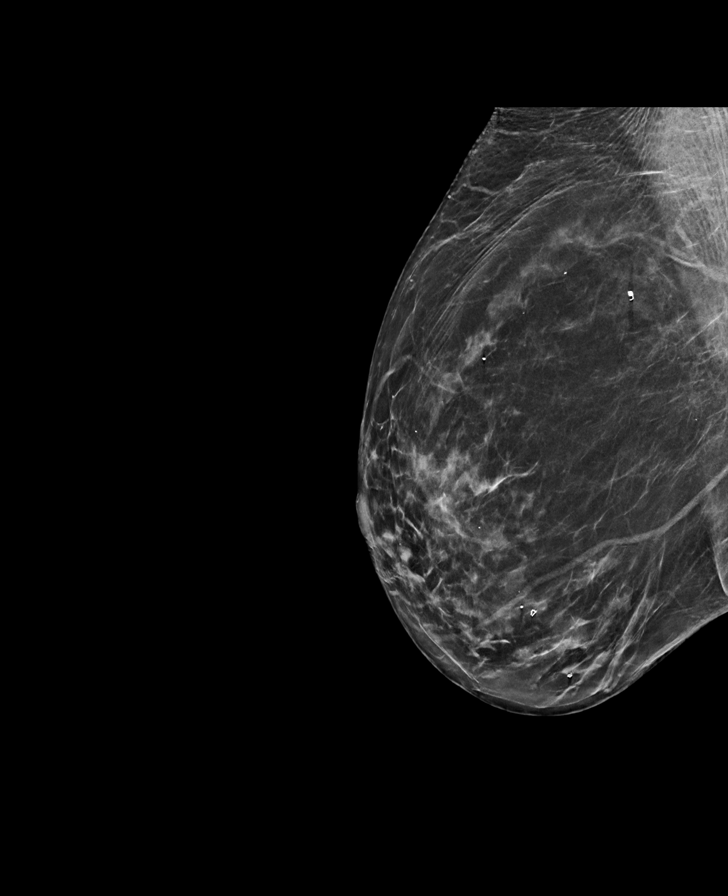

[L CC synth-2D]
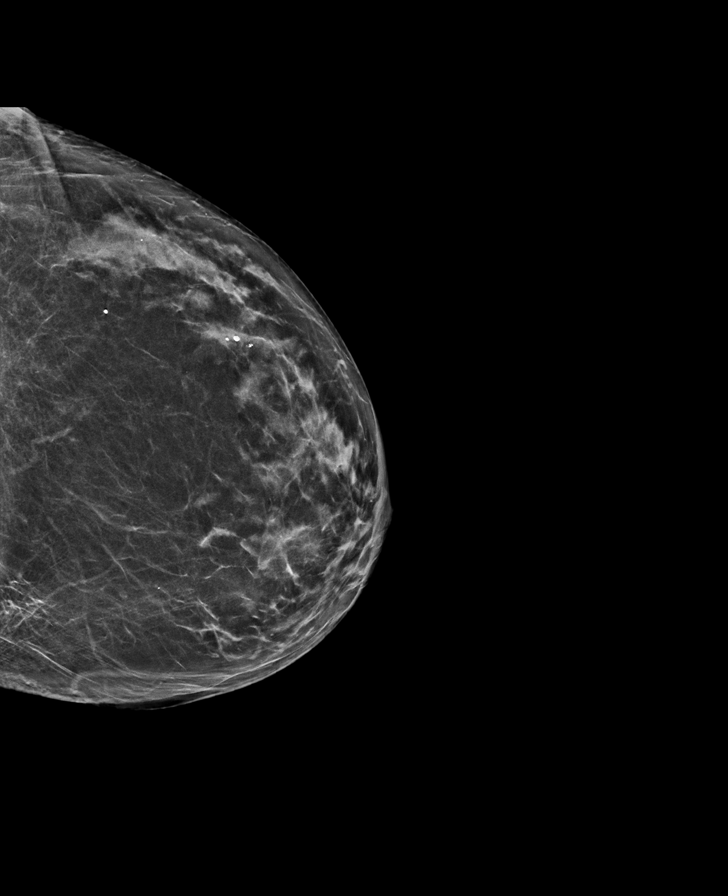

[L MLO synth-2D]
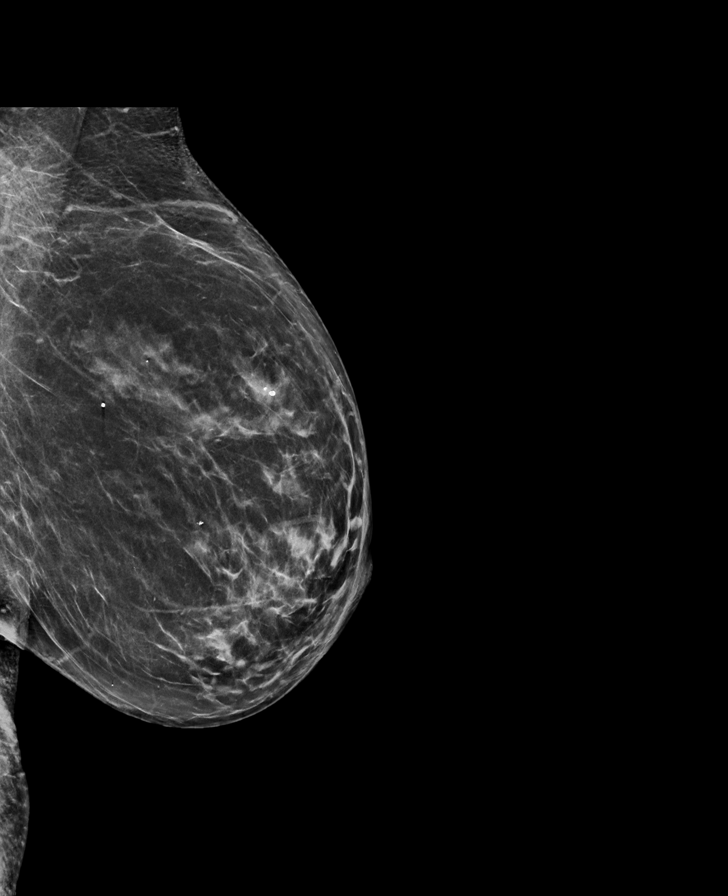

[L CC tomo · tomo slice 33/66.0]
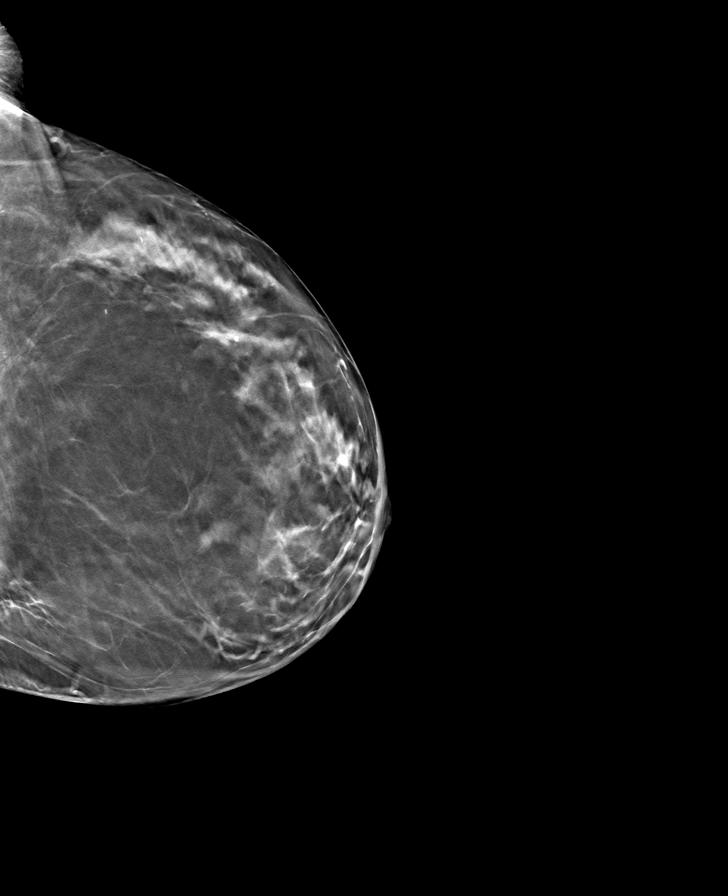

[L MLO tomo · tomo slice 35/70.0]
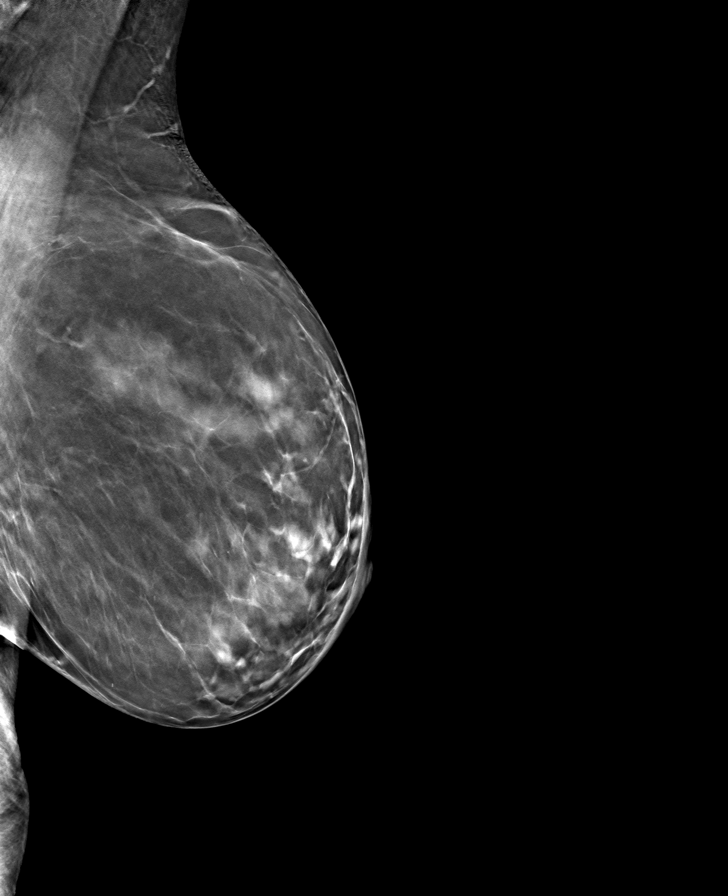

[R CC tomo · tomo slice 34/67.0]
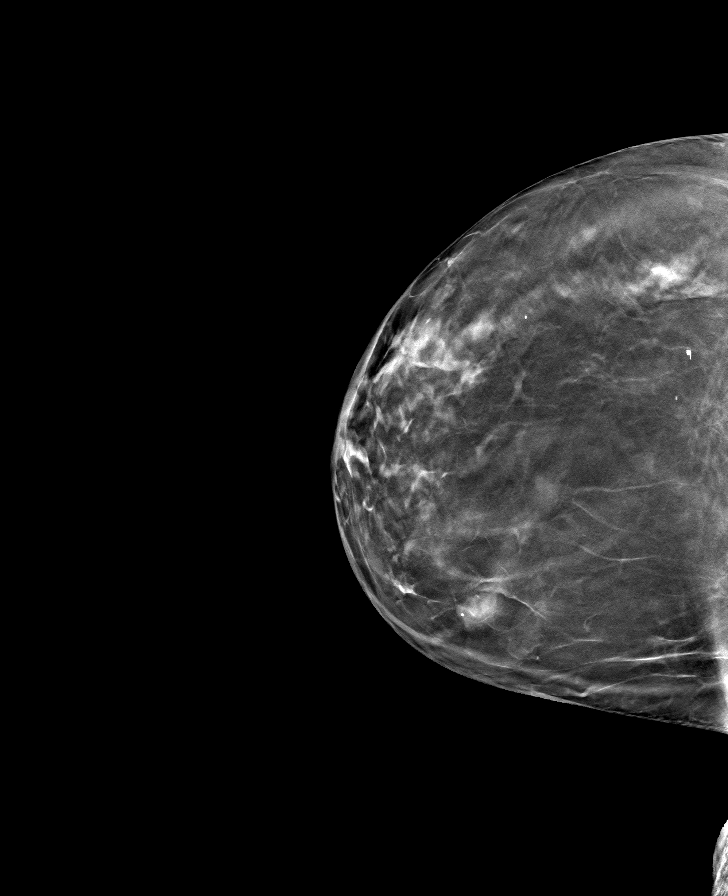

[R MLO tomo · tomo slice 35/70.0]
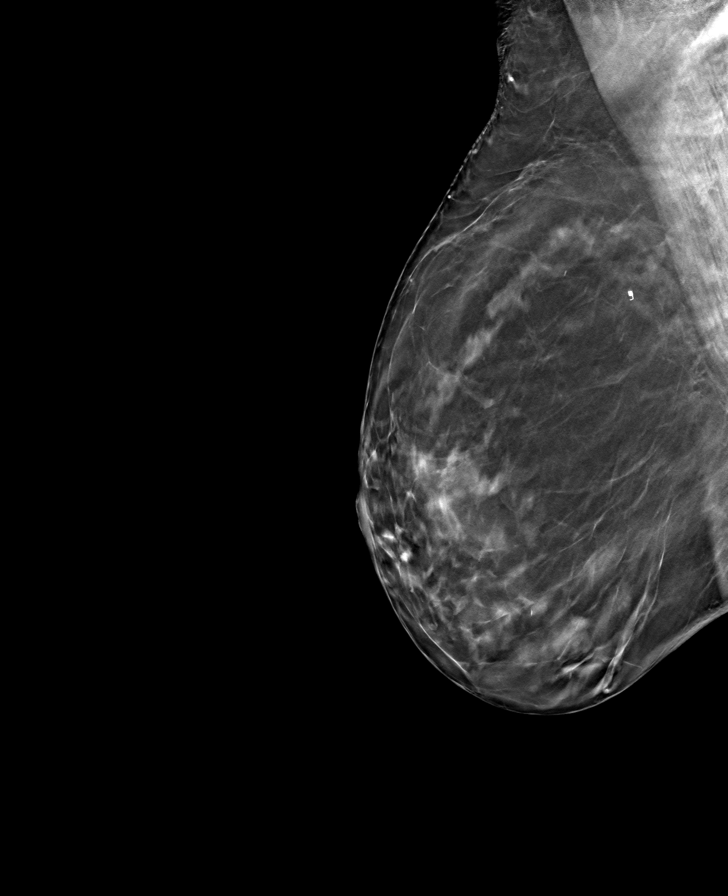

[8 of 24 positions shown; findings below may reference images not displayed]

ACR Breast Density Category b: There are scattered areas of
fibroglandular density.
FINDINGS: There are no findings suspicious for malignancy. The images were
evaluated with computer-aided detection.
IMPRESSION: No mammographic evidence of malignancy. A result letter of this
screening mammogram will be mailed directly to the patient.

RECOMMENDATION:
Screening mammogram in one year. (Code:[id number])

BI-RADS CATEGORY  1: Negative.

## 2021-10-18 DIAGNOSIS — M9902 Segmental and somatic dysfunction of thoracic region: Secondary | ICD-10-CM | POA: Diagnosis not present

## 2021-10-18 DIAGNOSIS — M4323 Fusion of spine, cervicothoracic region: Secondary | ICD-10-CM | POA: Diagnosis not present

## 2021-10-18 DIAGNOSIS — M9901 Segmental and somatic dysfunction of cervical region: Secondary | ICD-10-CM | POA: Diagnosis not present

## 2021-10-21 DIAGNOSIS — R252 Cramp and spasm: Secondary | ICD-10-CM | POA: Diagnosis not present

## 2021-10-21 DIAGNOSIS — H8113 Benign paroxysmal vertigo, bilateral: Secondary | ICD-10-CM | POA: Diagnosis not present

## 2021-10-21 DIAGNOSIS — M1990 Unspecified osteoarthritis, unspecified site: Secondary | ICD-10-CM | POA: Diagnosis not present

## 2021-10-21 DIAGNOSIS — F411 Generalized anxiety disorder: Secondary | ICD-10-CM | POA: Diagnosis not present

## 2021-10-21 DIAGNOSIS — E1169 Type 2 diabetes mellitus with other specified complication: Secondary | ICD-10-CM | POA: Diagnosis not present

## 2021-10-21 DIAGNOSIS — I10 Essential (primary) hypertension: Secondary | ICD-10-CM | POA: Diagnosis not present

## 2021-10-21 DIAGNOSIS — E039 Hypothyroidism, unspecified: Secondary | ICD-10-CM | POA: Diagnosis not present

## 2021-10-21 DIAGNOSIS — E785 Hyperlipidemia, unspecified: Secondary | ICD-10-CM | POA: Diagnosis not present

## 2021-10-21 DIAGNOSIS — I491 Atrial premature depolarization: Secondary | ICD-10-CM | POA: Diagnosis not present

## 2021-10-26 DIAGNOSIS — D485 Neoplasm of uncertain behavior of skin: Secondary | ICD-10-CM | POA: Diagnosis not present

## 2021-10-26 DIAGNOSIS — L728 Other follicular cysts of the skin and subcutaneous tissue: Secondary | ICD-10-CM | POA: Diagnosis not present

## 2021-10-26 DIAGNOSIS — L814 Other melanin hyperpigmentation: Secondary | ICD-10-CM | POA: Diagnosis not present

## 2021-10-26 DIAGNOSIS — L570 Actinic keratosis: Secondary | ICD-10-CM | POA: Diagnosis not present

## 2021-11-04 ENCOUNTER — Ambulatory Visit: Payer: Medicare PPO | Admitting: Neurology

## 2021-11-04 ENCOUNTER — Encounter: Payer: Self-pay | Admitting: Neurology

## 2021-11-04 VITALS — BP 171/86 | HR 65 | Ht 68.0 in | Wt 177.6 lb

## 2021-11-04 DIAGNOSIS — H8110 Benign paroxysmal vertigo, unspecified ear: Secondary | ICD-10-CM

## 2021-11-04 HISTORY — DX: Benign paroxysmal vertigo, unspecified ear: H81.10

## 2021-11-04 NOTE — Patient Instructions (Signed)
Benign Positional Vertigo Vertigo is the feeling that you or your surroundings are moving when they are not. Benign positional vertigo is the most common form of vertigo. This is usually a harmless condition (benign). This condition is positional. This means that symptoms are triggered by certain movements and positions. This condition can be dangerous if it occurs while you are doing something that could cause harm to yourself or others. This includes activities such as driving or operating machinery. What are the causes? The inner ear has fluid-filled canals that help your brain sense movement and balance. When the fluid moves, the brain receives messages about your body's position. With benign positional vertigo, calcium crystals in the inner ear break free and disturb the inner ear area. This causes your brain to receive confusing messages about your body's position. What increases the risk? You are more likely to develop this condition if: You are a woman. You are 50 years of age or older. You have recently had a head injury. You have an inner ear disease. What are the signs or symptoms? Symptoms of this condition usually happen when you move your head or your eyes in different directions. Symptoms may start suddenly and usually last for less than a minute. They include: Loss of balance and falling. Feeling like you are spinning or moving. Feeling like your surroundings are spinning or moving. Nausea and vomiting. Blurred vision. Dizziness. Involuntary eye movement (nystagmus). Symptoms can be mild and cause only minor problems, or they can be severe and interfere with daily life. Episodes of benign positional vertigo may return (recur) over time. Symptoms may also improve over time. How is this diagnosed? This condition may be diagnosed based on: Your medical history. A physical exam of the head, neck, and ears. Positional tests to check for or stimulate vertigo. You may be asked to  turn your head and change positions, such as going from sitting to lying down. A health care provider will watch for symptoms of vertigo. You may be referred to a health care provider who specializes in ear, nose, and throat problems (ENT or otolaryngologist) or a provider who specializes in disorders of the nervous system (neurologist). How is this treated?  This condition may be treated in a session in which your health care provider moves your head in specific positions to help the displaced crystals in your inner ear move. Treatment for this condition may take several sessions. Surgery may be needed in severe cases, but this is rare. In some cases, benign positional vertigo may resolve on its own in 2-4 weeks. Follow these instructions at home: Safety Move slowly. Avoid sudden body or head movements or certain positions, as told by your health care provider. Avoid driving or operating machinery until your health care provider says it is safe. Avoid doing any tasks that would be dangerous to you or others if vertigo occurs. If you have trouble walking or keeping your balance, try using a cane for stability. If you feel dizzy or unstable, sit down right away. Return to your normal activities as told by your health care provider. Ask your health care provider what activities are safe for you. General instructions Take over-the-counter and prescription medicines only as told by your health care provider. Drink enough fluid to keep your urine pale yellow. Keep all follow-up visits. This is important. Contact a health care provider if: You have a fever. Your condition gets worse or you develop new symptoms. Your family or friends notice any behavioral changes.   You have nausea or vomiting that gets worse. You have numbness or a prickling and tingling sensation. Get help right away if you: Have difficulty speaking or moving. Are always dizzy or faint. Develop severe headaches. Have weakness in  your legs or arms. Have changes in your hearing or vision. Develop a stiff neck. Develop sensitivity to light. These symptoms may represent a serious problem that is an emergency. Do not wait to see if the symptoms will go away. Get medical help right away. Call your local emergency services (911 in the [place] Do not drive yourself to the hospital. Summary Vertigo is the feeling that you or your surroundings are moving when they are not. Benign positional vertigo is the most common form of vertigo. This condition is caused by calcium crystals in the inner ear that become displaced. This causes a disturbance in an area of the inner ear that helps your brain sense movement and balance. Symptoms include loss of balance and falling, feeling that you or your surroundings are moving, nausea and vomiting, and blurred vision. This condition can be diagnosed based on symptoms, a physical exam, and positional tests. Follow safety instructions as told by your health care provider and keep all follow-up visits. This is important. This information is not intended to replace advice given to you by your health care provider. Make sure you discuss any questions you have with your health care provider. Document Revised: 01/08/2020 Document Reviewed: 01/08/2020 [employer]vier Patient Education  2023 [employer]  

## 2021-11-04 NOTE — Progress Notes (Signed)
SLEEP MEDICINE CLINIC    Provider:  Melvyn Novas, MD  Primary Care Physician:  Hurshel Party, NP [address] [place] [address]    Referring Provider: Hurshel Party, Np [address] [place],  [place] [address]      RV on 11-04-2021:  Last seen 10/29/20. Working w/ PCP/cardiologist on BP. VERTIGO has Improved , no spell in 24 months.  Ambulates with cane, 4 prong model.   Jessica [name] is a 81 year old  widowed Caucasian female patient seen here in  RV on 11/04/2021 . Jessica [name] reports that her dizziness has improved, her the orthostatic hypotension has improved, her headaches are not an issue anymore.  She is much improved overall, she is sitting here smiling she is conversant- She brought me her blood work which includes a overall very good cholesterol control, triglycerides are controlled, her HbA1c has decreased again to 6.1 as of August 31.   Blood pressures have still at times been high or low but in general she does not feel presyncopal these days. She had 2 syncopes- in 2019 , fell and got injured, first on the neck spine and seconds the femur was fractured. Walks with a 4 prong cane.     10/29/20 ALL:  Jessica [name] is a 81 y.o. female here today for follow up for previous concerns of vertigo. She was last seen 10/2019 and reported dizziness was resolved. Vestibular therapy was very helpful. BP had been fairly normal. Neck pain had resolved with traction and stretching with PT. Over the past year, She continues to do [name]. She is using a saline spray to help with nasal dryness and allergies. She reports that BP is usually 140's/80's. She is following with dentistry for lichen planus. She has cut back on citrus fruits and feels this may have helped. No other syncopal episodes.   HISTORY OF PRESENT ILLNESS: (copied from Dr Addalee Kavanagh's previous note)  Jessica [name] is a 81 year old Caucasian female patient seen here in a revisit  on 10/30/2019 . Until 2019  she had walked 3 miles 3 times a week. She was seen here since March 2021 and presented with vertigo at first- none since. Orthostatic dizziness has resolved.   She has less pain and neck tension as she underwent traction therapy, she has had a lot of massages and PT and her gait is much, much better. She feels less anxious and more trust in her own aiblity. She walked a mile and a half yesterday , with a 4 prong cane.    She had seen Dr. Dutch Quint in follow up after MRI results:  The brain MRI showed no abnormalities just some generalized cortical atrophy which is typical for the age not advanced, there were normal enhancement patterns chronic microvascular ischemic changes which are also age-related.  After infusion of contrast there was no abnormal enhancement.  No evidence of stroke normal pituitary gland.  As to her cervical spine she did have an area of severe by foraminal stenosis again this is not a spinal stenosis it is a stenosis of the exit holes of her cervical nerves between C3 and C4 and she is now treating that with decompression therapy basically stretching the cervical spine this would not be a surgical intervention at all.  I was glad that Dr. Dutch Quint referred her for the appropriate physical therapy and she is doing doing so much better with this conservative  approach.   08-19-2019 Had work up from cardiology and PCP.  she has undergone cardiac work up- reportedly her cardiologist was content with her results, and sees  in vestibular rehab. The patient kept a record of her vertigo spells in 2017 she had to, in 2018 for, none in 2019.  2020 she had 2 vertigo episodes but in 2021 between January and March 3, there were 23 vertigo episodes April 5, May 2021 and none in June. MRI brain was normal, metabolical blood work returned  all normal.  Dr. Trenton Gammon has not seen her for her neck.   She has rigidity in the neck , head feels  Very heavy- some discomfort and interferes with daily mobility. [name]  PT, Vest PT has helped greatly. She has still dizziness but not a true vertigo now.  today's also started with blood pressures on 29 July 2019 show a supine blood pressure of 158/74 and a heart rate of 63 regular.  Seated blood pressure of 168/82 and a heart rate of 66 regular and is standing blood pressure of 149/78 mmHg with a heart rate of 76.  This is a 20 point drop between seated and standing position.  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  She has a  has a past medical history of Arthritis, Dizziness/ Vertigo with spinning sensation ,  Syncope , and Hypothyroidism.Marland Kitchen  She was considered borderline DM and changed that with diet.  Today her spinning sensation occurred while on the commode to urinate.   She has been hydrating [name], 3 liters a day.  She has orthostatic problems. She has a PT that come to the home, had dry needling for neck ROM.   Had several orthopedic surgeries.    Social history:  Patient is retired from middle school and [employer] and lives in a household with her youngest step son-, is widowed. Husband had  Parkinson's disease, was seen by [facility] neurology. She has 2 stepsons. Pets are present, a dog.Tobacco use: never .  ETOH use moderately in the past, none for years , Caffeine intake in form of Coffee( 1 cup in AM ) Soda( rare ) Tea ( none) or energy drinks..   Hobbies : reading.   Sleep habits are as follows: The patient's dinner time is between 5.30- 6  PM. The patient goes to bed at 10 PM and continues to sleep for  hours, wakes for 1-2 bathroom breaks.   The preferred sleep position is right side, with the support of  pillows.  Dreams are reportedly frequent/vivid.  6.30  AM is the usual rise time. The patient wakes up spontaneously.  She reports not feeling refreshed or restored in AM.        Review of Systems: Out of a complete 14 system review, the patient  complains of only the following symptoms, and all other reviewed systems are negative. Depression score 2/ 15 - not clinically depressed.   VERTIGO has Improved , no spell in 24 months.   Feeling in motion when not moving, spinning sensation when at worst case scenario, left foot drop,  Stiffness of gait,  Dizziness when walking, orthostatic BP changes. Lightheadedness.   left head turn was a trigger for vertigo.     Social History   Socioeconomic History   Marital status: Widowed    Spouse name: Not on file   Number of children: Not on  file   Years of education: Not on file   Highest education level: Not on file  Occupational History   Not on file  Tobacco Use   Smoking status: Never   Smokeless tobacco: Never  Vaping Use   Vaping Use: Never used  Substance and Sexual Activity   Alcohol use: Not Currently    Comment: rarely   Drug use: No   Sexual activity: Never  Other Topics Concern   Not on file  Social History Narrative   Not on file   Social Determinants of Health   Financial Resource Strain: Not on file  Food Insecurity: Not on file  Transportation Needs: Not on file  Physical Activity: Not on file  Stress: Not on file  Social Connections: Not on file    Family History  Problem Relation Age of Onset   Dementia Mother    Osteoporosis Mother    Other Father        Senescence   Prostate cancer Maternal Grandfather     Past Medical History:  Diagnosis Date   Arthritis    oa   Auditory vertigo 04/25/2019   Cervical spinal stenosis 12/13/2011   Cervical spondylosis 09/11/2019   Cervicalgia of occipito-atlanto-axial region 07/29/2019   Closed nondisplaced fracture of second cervical vertebra ([facility]) 06/07/2017   Controlled type 2 diabetes mellitus with diabetic nephropathy, without long-term current use of insulin ([facility]) 08/21/2017   Degenerative cervical disc 10/30/2019   Displacement of lumbar intervertebral disc without myelopathy 04/11/2013   Dizziness     related seeing vestibular md for inner ear   Epistaxis 10/07/2019   Essential hypertension 11/23/2018   Hyperlipidemia 08/21/2017   Hypothyroidism    Lumbar spondylosis 10/01/2013   Lumbosacral spondylosis without myelopathy 03/29/2013   Multiple falls 04/25/2019   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   Post-traumatic headache, not intractable 04/25/2019   S/P knee replacement 01/25/2016   Sensorineural hearing loss (SNHL) of left ear 04/25/2019   Spinal stenosis of lumbar region 04/11/2013   Syncope 08/21/2017   Tremor observed on examination 04/25/2019    Past Surgical History:  Procedure Laterality Date   ANTERIOR CERVICAL DECOMP/DISCECTOMY FUSION  12/13/2011   Procedure: ANTERIOR CERVICAL DECOMPRESSION/DISCECTOMY FUSION 3 LEVELS;  Surgeon: Charlie Pitter, MD;  Location: [facility];  Service: Neurosurgery;  Laterality: N/A;  Cervical Five-Six Cervical Six-Seven Cervical Seven-Thoracic One Anterior cervical decompression/diskectomy/fusion/plating/allograft   BREAST BIOPSY Right 04/29/1998   BREAST BIOPSY Right 05/02/2019   CARPAL TUNNEL RELEASE     right   EYE SURGERY     cataract bilateral   FOOT ARTHRODESIS Left 06/25/2015   Procedure: HALLUX INTERPHALANGEAL JOINT  ARTHRODESIS;  Surgeon: Wylene Simmer, MD;  Location: [facility];  Service: Orthopedics;  Laterality: Left;   GASTROCNEMIUS RECESSION Left 06/25/2015   Procedure: LEFT GASTROC RECESSION ;  Surgeon: Wylene Simmer, MD;  Location: [facility];  Service: Orthopedics;  Laterality: Left;   HIP FRACTURE SURGERY Left    TENDON REPAIR Left 06/25/2015   Procedure: LEFT ANTERIOR TIBIAL TENDON RECONSTRUCTION;  EXTENSOR HALLUCIS LONGUS TRANSFER ;  Surgeon: Wylene Simmer, MD;  Location: [facility];  Service: Orthopedics;  Laterality: Left;   TOTAL KNEE ARTHROPLASTY Right 01/25/2016   Procedure: RIGHT TOTAL KNEE ARTHROPLASTY;  Surgeon: Paralee Cancel, MD;  Location: [facility];  Service: Orthopedics;   Laterality: Right;   TUBAL LIGATION       Current Outpatient Medications on File Prior to Visit  Medication Sig Dispense Refill  Calcium Carb-Cholecalciferol (CALCIUM 500+D PO) Take 1 tablet by mouth daily.     citalopram (CELEXA) 10 MG tablet Take 10 mg by mouth at bedtime.     ergocalciferol (VITAMIN D2) 50000 UNITS capsule Take 50,000 Units by mouth as directed. 2 times a month-Friday     levothyroxine (SYNTHROID) 88 MCG tablet Take 88 mcg by mouth daily.     metoprolol succinate (TOPROL-XL) 25 MG 24 hr tablet TAKE 1 TABLET(25 MG) BY MOUTH DAILY 90 tablet 2   Multiple Vitamin (MULTIVITAMIN WITH MINERALS) TABS tablet Take 7 tablets by mouth daily. [place] Multivitamin regimen     rosuvastatin (CRESTOR) 5 MG tablet Take 5 mg by mouth 4 (four) times a week.     No current facility-administered medications on file prior to visit.    No Known Allergies  Physical exam:  Today's Vitals   11/04/21 1026  BP: (!) 171/86  Pulse: 65  Weight: 177 lb 9.6 oz (80.6 kg)  Height: 5\' 8"  (1.727 m)   Body mass index is 27 kg/m.   Wt Readings from Last 3 Encounters:  11/04/21 177 lb 9.6 oz (80.6 kg)  06/21/21 178 lb 9.6 oz (81 kg)  10/29/20 176 lb (79.8 kg)     Ht Readings from Last 3 Encounters:  11/04/21 5\' 8"  (1.727 m)  06/21/21 5\' 8"  (1.727 m)  10/29/20 5\' 8"  (1.727 m)      General: The patient is awake, alert and appears not in acute distress. The patient is [name] groomed. Head: Normocephalic, atraumatic. Neck is supple. Mallampati 3.,  neck circumference:14.5 inches . Nasal airflow patent.  Retrognathia is not  seen.  Dental status:  dentures Cardiovascular:  irregular rate and cardiac rhythm by pulse,  without distended neck veins. Respiratory: Lungs are clear to auscultation.  Skin:  Without evidence of ankle edema, or rash. Trunk: The patient's posture is erect, not stooped.    Neurologic exam : The patient is awake and alert, oriented to place and time.   Memory  subjective described as intact.  Attention span & concentration ability appears normal.  Speech is fluent,  without  dysarthria, dysphonia or aphasia.  Mood and affect are appropriate.   Cranial nerves: no loss of smell or taste reported  Pupils are equal and briskly reactive to light. Funduscopic exam ; status post cataract surgery-  Extraocular movements in vertical and horizontal planes were intact and without nystagmus. No Diplopia. Visual fields by finger perimetry are intact. Hearing was impaired on the left- uses a hearing aid.  Facial sensation intact to fine touch.  Facial motor strength is symmetric and tongue and uvula move midline.  Slight jaw tremor, no tongue tremor.  Neck ROM : rotation, tilt and flexion extension were restricted  for age and shoulder shrug was lower on the left   none in the right  Motor exam:  increased tone in the left arm, biceps, cog-wheeling. Symmetric bulk, \ ROM.   Normal tone without cog-wheeling, symmetric grip strength . Sensory:  Fine touch, pinprick and vibration were  normal.  Proprioception tested in the upper extremities was normal. Coordination: Rapid alternating movements in the fingers/hands were of normal speed.  The Finger-to-nose maneuver was impaired - there was  evidence of ataxia, dysmetria or tremor. There is tremor in both hands.    Gait and station: Patient could rise unassisted from a seated position, walked without assistive device.  Stance is of normal width/ base and the patient turned with 4 steps.  She  walked very rigid, erect and showed a clear foot drop.  Toe and heel walk were deferred.  Deep tendon reflexes: in the  upper and lower extremities are symmetric and intact.  Babinski response was deferred.     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   Her exam at that time was unremarkable and the evaluations were  30-day Holter monitor revealed no cardiac abnormalities.   Even at that time she had multiple symptoms of lightheadedness and dizziness.  She had premature atrial count tractions on her EKG and her cardiologist chose to put her on a beta-blocker to reduce these extrasystoles.  There was no chest pain.  In the meantime her gait has become more unsteady and she feels more insecure as she walks plays a certain level of hypervigilance.  We started our exam after a lengthy interview with orthostatic blood pressure measures and she shows a quite significant drop in her supine to standing blood pressure a total of 28 mmHg by her systolic blood pressure dropped her diastolic blood pressure remained stable and her heart rate varied not very greatly.  Between supine and seated position she felt dizzy and she felt as if in motion although she was not moving, for the second part changing from a seated to a standing position she felt lightheaded as [name].  Her gait is significantly affected there is #1 her foot drop she walks like a person with a neuropathy that does not feel the ground she walks on there is an insecurity of where to place her feet and her tendency is to walk the ground to see where she steps, because she does not have a reliable feedback mechanism. Has the typical electric shock sensations arising from her feet and ankles with tops of my feet and the tops of her feet tingle.    Further noted were bilateral probably essential tremor with some cogwheeling over the biceps area usually a sign of Parkinson's disease but she had a history of restricted range of motion for neck and shoulder on the left and actually had a cervical spine surgery fusion.   The vestibular organ is a possible cause because rapid changing of the head to the left can bring on a spinning sensation of vertigo.  She has this problem rarely.    Has the typical electric shock sensations arising from her feet and ankles with tops of my feet  and the tops of her feet tingle. Unchanged.    #2 orthopedic reasons mainly foot drop, knee and hip replacement.     #3 orthostatic dysautonomia-today's also started with blood pressures on 29 July 2019 show a supine blood pressure of 158/74 and a heart rate of 63 regular.  Seated blood pressure of 168/82 and a heart rate of 66 regular and is standing blood pressure of 149/78 mmHg with a heart rate of 76.  This is a 20 point drop between seated and standing position.  #4she very likely some vestibulitis.  The vestibular organ is a possible cause because rapid changing of the head to the left can bring on a spinning sensation of vertigo.  She has this problem rarely now- she reports a sharp pain if she sneezes, coughs or bears down..    #5She broke her odontoid process C 2 in a fall and has a record of 3 concussions. 2 with the last falls.  This may be the  only other angle I have in evaluating her.  I would like for her to undergo an MRI of the cervical spine with and without contrast to look at the odontoid processes but also if there is a significant foraminal stenosis or spinal canal stenosis.     After spending a total time of  15 minutes face to face with  time for physical and neurologic examination, review of laboratory studies,  personal review of imaging studies, reports and results of other testing and review of referral information / records as far as provided in visit, I have established the following assessments   My Plan is to proceed with:  1) vertigo  improved with vestibular rehab. No further treatment- continue exercises , walking, and keep control of BOP and Blood glucose. No tinnitus.  2) MRI brain was normal, cervical MRI with advanced bone degeneration- this is not a neurological issue, more orthopedic.  3) continue PT, I do not usually endorse chiropractic treatment but I would endorse a chiropractor that would not check or apply pressure to her neck.  I think it is worse  trying this out.  I would like to thank Manson Allan, Amy A, NP, for allowing me to meet with and to take care of this pleasant patient.   No follow up ordered now, I am happy to see her PRN if symptoms arise.  CC: I will share my notes with PCP and Dr Trenton Gammon, Dr Ballard Russell.  Electronically signed by: Larey Seat, MD 11/04/2021 11:05 AM  [facility] Board certified by The [employer] of Sleep Medicine and Diplomate of the [employer] of Sleep Medicine. Board certified In Neurology through the [place], Fellow of the [employer] of Neurology. Medical Director of [employer].

## 2021-11-15 DIAGNOSIS — M9901 Segmental and somatic dysfunction of cervical region: Secondary | ICD-10-CM | POA: Diagnosis not present

## 2021-11-15 DIAGNOSIS — M4323 Fusion of spine, cervicothoracic region: Secondary | ICD-10-CM | POA: Diagnosis not present

## 2021-11-15 DIAGNOSIS — M9902 Segmental and somatic dysfunction of thoracic region: Secondary | ICD-10-CM | POA: Diagnosis not present

## 2021-11-26 ENCOUNTER — Other Ambulatory Visit: Payer: Self-pay | Admitting: Cardiology

## 2021-11-26 NOTE — Telephone Encounter (Signed)
Refills sent to pharmacy. 

## 2021-12-20 DIAGNOSIS — M9902 Segmental and somatic dysfunction of thoracic region: Secondary | ICD-10-CM | POA: Diagnosis not present

## 2021-12-20 DIAGNOSIS — M9901 Segmental and somatic dysfunction of cervical region: Secondary | ICD-10-CM | POA: Diagnosis not present

## 2021-12-20 DIAGNOSIS — M4323 Fusion of spine, cervicothoracic region: Secondary | ICD-10-CM | POA: Diagnosis not present

## 2021-12-21 DIAGNOSIS — M810 Age-related osteoporosis without current pathological fracture: Secondary | ICD-10-CM | POA: Diagnosis not present

## 2021-12-27 DIAGNOSIS — E785 Hyperlipidemia, unspecified: Secondary | ICD-10-CM | POA: Diagnosis not present

## 2021-12-27 DIAGNOSIS — R1013 Epigastric pain: Secondary | ICD-10-CM | POA: Diagnosis not present

## 2021-12-31 ENCOUNTER — Ambulatory Visit: Payer: Medicare PPO | Admitting: Podiatry

## 2021-12-31 DIAGNOSIS — M2170 Unequal limb length (acquired), unspecified site: Secondary | ICD-10-CM

## 2021-12-31 DIAGNOSIS — M19172 Post-traumatic osteoarthritis, left ankle and foot: Secondary | ICD-10-CM

## 2021-12-31 DIAGNOSIS — M79675 Pain in left toe(s): Secondary | ICD-10-CM

## 2021-12-31 DIAGNOSIS — B351 Tinea unguium: Secondary | ICD-10-CM

## 2021-12-31 DIAGNOSIS — M79674 Pain in right toe(s): Secondary | ICD-10-CM

## 2021-12-31 MED ORDER — CICLOPIROX 8 % EX SOLN: Freq: Every day | CUTANEOUS | 0 refills | Status: DC

## 2021-12-31 NOTE — Progress Notes (Signed)
Subjective:  Patient ID: Jessica [name], female    DOB: 1940/08/25,  MRN: [id number]  Chief Complaint  Patient presents with   Nail Problem    Patient right great toe nail isnt growing. Patient is not diabetic. Patient has used over the counter nail fungus topical medication.   Numbness    Patient bilateral feet goes numb. Circulation.     81 y.o. female presents with concern for right hallux nail not growing and also discoloration and abnormal growth.  She has tried over-the-counter topical medications but never any prescription.  She has also tried terbinafine oral antifungal in the past.  She is concerned about her circulation as well wants to have that evaluated this visit as she is diabetic.  Also reports unequal limb length and a prior left ankle fracture which was treated nonoperatively and some occasional pain to the left lateral ankle.  Past Medical History:  Diagnosis Date   Arthritis    oa   Auditory vertigo 04/25/2019   Cervical spinal stenosis 12/13/2011   Cervical spondylosis 09/11/2019   Cervicalgia of occipito-atlanto-axial region 07/29/2019   Closed nondisplaced fracture of second cervical vertebra ([facility]) 06/07/2017   Controlled type 2 diabetes mellitus with diabetic nephropathy, without long-term current use of insulin ([facility]) 08/21/2017   Degenerative cervical disc 10/30/2019   Displacement of lumbar intervertebral disc without myelopathy 04/11/2013   Dizziness    related seeing vestibular md for inner ear   Epistaxis 10/07/2019   Essential hypertension 11/23/2018   Hyperlipidemia 08/21/2017   Hypothyroidism    Lumbar spondylosis 10/01/2013   Lumbosacral spondylosis without myelopathy 03/29/2013   Multiple falls 04/25/2019   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   Post-traumatic headache, not intractable 04/25/2019   S/P knee replacement 01/25/2016   Sensorineural hearing loss (SNHL) of left ear 04/25/2019   Spinal stenosis of lumbar region 04/11/2013   Syncope 08/21/2017    Tremor observed on examination 04/25/2019    No Known Allergies  ROS: Negative except as per HPI above  Objective:  General: AAO x3, NAD  Dermatological: Bilateral hallux nail with yellow discoloration thickening and abnormal dystrophic growth.  No pain on palpation of either hallux nail.  No evidence of ingrown.  Vascular:  Dorsalis Pedis artery and Posterior Tibial artery pedal pulses are 2/4 bilateral.  Capillary fill time < 3 sec to all digits.   Neruologic: Grossly intact via light touch bilateral. Protective threshold intact to all sites bilateral.   Musculoskeletal: Left lower extremity pain at the lateral ankle around the distal fibula.  Left lower extremity slightly shorter than right lower extremity approximately 1/4 to 1/2 inch.  Gait: Device assisted with cane.  No images are attached to the encounter.   Assessment:   1. Pain due to onychomycosis of toenails of both feet   2. Acquired unequal limb length   3. Post-traumatic arthritis of left ankle      Plan:  Patient was evaluated and treated and all questions answered.  Onychomycosis -Educated on etiology of nail fungus. -eRx for penlac 8% solution applied daily to bilateral hallux nail -Follow-up in 3 months for recheck  #Posttraumatic arthritis left ankle and unable limb length left lower extremity -Recommend Voltaren gel topically as needed for pain control left lower extremity. -Recommend use of heel lift which the patient has 1/4 inch heel lift in her shoe on the left side    Return in about 3 months (around 04/02/2022) for Follow up nail fungus bilat great toe.  Everitt Amber, DPM [facility]

## 2022-01-04 DIAGNOSIS — K7689 Other specified diseases of liver: Secondary | ICD-10-CM | POA: Diagnosis not present

## 2022-01-04 DIAGNOSIS — R1013 Epigastric pain: Secondary | ICD-10-CM | POA: Diagnosis not present

## 2022-01-04 DIAGNOSIS — N281 Cyst of kidney, acquired: Secondary | ICD-10-CM | POA: Diagnosis not present

## 2022-01-04 DIAGNOSIS — R1011 Right upper quadrant pain: Secondary | ICD-10-CM | POA: Diagnosis not present

## 2022-01-17 DIAGNOSIS — M9901 Segmental and somatic dysfunction of cervical region: Secondary | ICD-10-CM | POA: Diagnosis not present

## 2022-01-17 DIAGNOSIS — M9902 Segmental and somatic dysfunction of thoracic region: Secondary | ICD-10-CM | POA: Diagnosis not present

## 2022-01-17 DIAGNOSIS — M4323 Fusion of spine, cervicothoracic region: Secondary | ICD-10-CM | POA: Diagnosis not present

## 2022-02-07 DIAGNOSIS — M9901 Segmental and somatic dysfunction of cervical region: Secondary | ICD-10-CM | POA: Diagnosis not present

## 2022-02-07 DIAGNOSIS — M4323 Fusion of spine, cervicothoracic region: Secondary | ICD-10-CM | POA: Diagnosis not present

## 2022-02-07 DIAGNOSIS — M9902 Segmental and somatic dysfunction of thoracic region: Secondary | ICD-10-CM | POA: Diagnosis not present

## 2022-02-24 DIAGNOSIS — H8113 Benign paroxysmal vertigo, bilateral: Secondary | ICD-10-CM | POA: Diagnosis not present

## 2022-02-24 DIAGNOSIS — M1990 Unspecified osteoarthritis, unspecified site: Secondary | ICD-10-CM | POA: Diagnosis not present

## 2022-02-24 DIAGNOSIS — E785 Hyperlipidemia, unspecified: Secondary | ICD-10-CM | POA: Diagnosis not present

## 2022-02-24 DIAGNOSIS — R1013 Epigastric pain: Secondary | ICD-10-CM | POA: Diagnosis not present

## 2022-02-24 DIAGNOSIS — F411 Generalized anxiety disorder: Secondary | ICD-10-CM | POA: Diagnosis not present

## 2022-02-24 DIAGNOSIS — E1169 Type 2 diabetes mellitus with other specified complication: Secondary | ICD-10-CM | POA: Diagnosis not present

## 2022-02-24 DIAGNOSIS — E039 Hypothyroidism, unspecified: Secondary | ICD-10-CM | POA: Diagnosis not present

## 2022-02-24 DIAGNOSIS — I10 Essential (primary) hypertension: Secondary | ICD-10-CM | POA: Diagnosis not present

## 2022-02-25 LAB — HEMOGLOBIN A1C: A1c: 6.2

## 2022-03-03 DIAGNOSIS — M85851 Other specified disorders of bone density and structure, right thigh: Secondary | ICD-10-CM | POA: Diagnosis not present

## 2022-03-03 DIAGNOSIS — M8589 Other specified disorders of bone density and structure, multiple sites: Secondary | ICD-10-CM | POA: Diagnosis not present

## 2022-03-14 DIAGNOSIS — M9901 Segmental and somatic dysfunction of cervical region: Secondary | ICD-10-CM | POA: Diagnosis not present

## 2022-03-14 DIAGNOSIS — M4323 Fusion of spine, cervicothoracic region: Secondary | ICD-10-CM | POA: Diagnosis not present

## 2022-03-14 DIAGNOSIS — M9902 Segmental and somatic dysfunction of thoracic region: Secondary | ICD-10-CM | POA: Diagnosis not present

## 2022-04-08 ENCOUNTER — Ambulatory Visit: Payer: Medicare PPO | Admitting: Podiatry

## 2022-04-15 ENCOUNTER — Other Ambulatory Visit: Payer: Self-pay | Admitting: Internal Medicine

## 2022-04-15 DIAGNOSIS — Z1231 Encounter for screening mammogram for malignant neoplasm of breast: Secondary | ICD-10-CM

## 2022-04-18 DIAGNOSIS — M9901 Segmental and somatic dysfunction of cervical region: Secondary | ICD-10-CM | POA: Diagnosis not present

## 2022-04-18 DIAGNOSIS — M9902 Segmental and somatic dysfunction of thoracic region: Secondary | ICD-10-CM | POA: Diagnosis not present

## 2022-04-18 DIAGNOSIS — M4323 Fusion of spine, cervicothoracic region: Secondary | ICD-10-CM | POA: Diagnosis not present

## 2022-05-27 DIAGNOSIS — Z1231 Encounter for screening mammogram for malignant neoplasm of breast: Secondary | ICD-10-CM | POA: Diagnosis not present

## 2022-05-30 DIAGNOSIS — M9901 Segmental and somatic dysfunction of cervical region: Secondary | ICD-10-CM | POA: Diagnosis not present

## 2022-05-30 DIAGNOSIS — M4323 Fusion of spine, cervicothoracic region: Secondary | ICD-10-CM | POA: Diagnosis not present

## 2022-05-30 DIAGNOSIS — M9902 Segmental and somatic dysfunction of thoracic region: Secondary | ICD-10-CM | POA: Diagnosis not present

## 2022-06-02 ENCOUNTER — Ambulatory Visit: Payer: Medicare PPO

## 2022-06-27 ENCOUNTER — Encounter: Payer: Self-pay | Admitting: Cardiology

## 2022-06-27 ENCOUNTER — Ambulatory Visit: Payer: Medicare PPO | Attending: Cardiology | Admitting: Cardiology

## 2022-06-27 VITALS — BP 136/86 | HR 65 | Ht 68.0 in | Wt 178.2 lb

## 2022-06-27 DIAGNOSIS — I10 Essential (primary) hypertension: Secondary | ICD-10-CM

## 2022-06-27 DIAGNOSIS — E782 Mixed hyperlipidemia: Secondary | ICD-10-CM | POA: Diagnosis not present

## 2022-06-27 DIAGNOSIS — R011 Cardiac murmur, unspecified: Secondary | ICD-10-CM

## 2022-06-27 NOTE — Progress Notes (Signed)
Cardiology Office Note:    Date:  06/27/2022   ID:  Jessica [name], DOB 09/25/1940, MRN [id number]  PCP:  Hurshel Party, NP  Cardiologist:  Garwin Brothers, MD   Referring MD: Hurshel Party, NP    ASSESSMENT:    1. Essential hypertension   2. Mixed hyperlipidemia   3. Murmur, cardiac    PLAN:    In order of problems listed above:  Primary prevention stressed with the patient.  Importance of compliance with diet medication stressed and patient verbalized standing.  She ambulates with a cane and is active and ambulates age appropriately. Essential hypertension: Blood pressure stable and diet was emphasized.  She has brought blood pressure readings at times stable. Cardiac murmur: Echocardiogram will be done to assess murmur heard on auscultation. Mixed dyslipidemia: On lipid-lowering medications followed by primary care.  Lipids were reviewed from [facility] sheet and discussed with the patient. Patient will be seen in follow-up appointment in 6 months or earlier if the patient has any concerns.    Medication Adjustments/Labs and Tests Ordered: Current medicines are reviewed at length with the patient today.  Concerns regarding medicines are outlined above.  Orders Placed This Encounter  Procedures   EKG 12-Lead   ECHOCARDIOGRAM COMPLETE   No orders of the defined types were placed in this encounter.    No chief complaint on file.    History of Present Illness:    Jessica [name] is a 82 y.o. female.  Patient has past medical history of essential hypertension, dyslipidemia.  She denies any problems at this time and takes care of activities of daily living.  No chest pain orthopnea or PND.  She is to have swings in blood pressures in the past and they have stabilized.  Past Medical History:  Diagnosis Date   Arthritis    oa   Auditory vertigo 04/25/2019   Benign paroxysmal positional vertigo 11/04/2021   Cervical spinal stenosis 12/13/2011   Cervical spondylosis 09/11/2019    Cervicalgia of occipito-atlanto-axial region 07/29/2019   Closed nondisplaced fracture of second cervical vertebra (HCC) 06/07/2017   Controlled type 2 diabetes mellitus with diabetic nephropathy, without long-term current use of insulin (HCC) 08/21/2017   Degenerative cervical disc 10/30/2019   Displacement of lumbar intervertebral disc without myelopathy 04/11/2013   Dizziness    related seeing vestibular md for inner ear   Epistaxis 10/07/2019   Essential hypertension 11/23/2018   Hyperlipidemia 08/21/2017   Hypothyroidism    Lumbar spondylosis 10/01/2013   Lumbosacral spondylosis without myelopathy 03/29/2013   Multiple falls 04/25/2019   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   Post-traumatic headache, not intractable 04/25/2019   S/P knee replacement 01/25/2016   Sensorineural hearing loss (SNHL) of left ear 04/25/2019   Spinal stenosis of lumbar region 04/11/2013   Syncope 08/21/2017   Tremor observed on examination 04/25/2019    Past Surgical History:  Procedure Laterality Date   ANTERIOR CERVICAL DECOMP/DISCECTOMY FUSION  12/13/2011   Procedure: ANTERIOR CERVICAL DECOMPRESSION/DISCECTOMY FUSION 3 LEVELS;  Surgeon: Temple Pacini, MD;  Location: [facility];  Service: Neurosurgery;  Laterality: N/A;  Cervical Five-Six Cervical Six-Seven Cervical Seven-Thoracic One Anterior cervical decompression/diskectomy/fusion/plating/allograft   BREAST BIOPSY Right 04/29/1998   BREAST BIOPSY Right 05/02/2019   CARPAL TUNNEL RELEASE     right   EYE SURGERY     cataract bilateral   FOOT ARTHRODESIS Left 06/25/2015   Procedure: HALLUX INTERPHALANGEAL JOINT  ARTHRODESIS;  Surgeon: Toni Arthurs, MD;  Location: [facility];  Service: Orthopedics;  Laterality: Left;   GASTROCNEMIUS RECESSION Left 06/25/2015   Procedure: LEFT GASTROC RECESSION ;  Surgeon: Toni Arthurs, MD;  Location: [facility];  Service: Orthopedics;  Laterality: Left;   HIP  FRACTURE SURGERY Left    TENDON REPAIR Left 06/25/2015   Procedure: LEFT ANTERIOR TIBIAL TENDON RECONSTRUCTION;  EXTENSOR HALLUCIS LONGUS TRANSFER ;  Surgeon: Toni Arthurs, MD;  Location: [facility];  Service: Orthopedics;  Laterality: Left;   TOTAL KNEE ARTHROPLASTY Right 01/25/2016   Procedure: RIGHT TOTAL KNEE ARTHROPLASTY;  Surgeon: Durene Romans, MD;  Location: [facility];  Service: Orthopedics;  Laterality: Right;   TUBAL LIGATION      Current Medications: Current Meds  Medication Sig   Calcium Carb-Cholecalciferol (CALCIUM 500+D PO) Take 1 tablet by mouth daily.   citalopram (CELEXA) 10 MG tablet Take 10 mg by mouth at bedtime.   ergocalciferol (VITAMIN D2) 50000 UNITS capsule Take 50,000 Units by mouth as directed. 2 times a month-Friday   levothyroxine (SYNTHROID) 88 MCG tablet Take 88 mcg by mouth daily.   metoprolol succinate (TOPROL-XL) 25 MG 24 hr tablet TAKE 1 TABLET(25 MG) BY MOUTH DAILY   Multiple Vitamin (MULTIVITAMIN WITH MINERALS) TABS tablet Take 7 tablets by mouth daily. [place] Multivitamin regimen   rosuvastatin (CRESTOR) 5 MG tablet Take 5 mg by mouth 4 (four) times a week.     Allergies:   Patient has no known allergies.   Social History   Socioeconomic History   Marital status: Widowed    Spouse name: Not on file   Number of children: Not on file   Years of education: Not on file   Highest education level: Not on file  Occupational History   Not on file  Tobacco Use   Smoking status: Never   Smokeless tobacco: Never  Vaping Use   Vaping Use: Never used  Substance and Sexual Activity   Alcohol use: Not Currently    Comment: rarely   Drug use: No   Sexual activity: Never  Other Topics Concern   Not on file  Social History Narrative   Not on file   Social Determinants of Health   Financial Resource Strain: Not on file  Food Insecurity: Not on file  Transportation Needs: Not on file  Physical Activity: Not on file  Stress: Not on  file  Social Connections: Not on file     Family History: The patient's family history includes Dementia in her mother; Osteoporosis in her mother; Other in her father; Prostate cancer in her maternal grandfather.  ROS:   Please see the history of present illness.    All other systems reviewed and are negative.  EKGs/Labs/Other Studies Reviewed:    The following studies were reviewed today: EKG revealed sinus rhythm with nonspecific ST-T changes   Recent Labs: No results found for requested labs within last 365 days.  Recent Lipid Panel No results found for: "CHOL", "TRIG", "HDL", "CHOLHDL", "VLDL", "LDLCALC", "LDLDIRECT"  Physical Exam:    VS:  BP 136/86   Pulse 65   Ht 5\' 8"  (1.727 m)   Wt 178 lb 3.2 oz (80.8 kg)   SpO2 97%   BMI 27.10 kg/m     Wt Readings from Last 3 Encounters:  06/27/22 178 lb 3.2 oz (80.8 kg)  11/04/21 177 lb 9.6 oz (80.6 kg)  06/21/21 178 lb 9.6 oz (81 kg)     GEN: Patient is in no acute distress HEENT:  Normal NECK: No JVD; No carotid bruits LYMPHATICS: No lymphadenopathy CARDIAC: Hear sounds regular, 2/6 systolic murmur at the apex. RESPIRATORY:  Clear to auscultation without rales, wheezing or rhonchi  ABDOMEN: Soft, non-tender, non-distended MUSCULOSKELETAL:  No edema; No deformity  SKIN: Warm and dry NEUROLOGIC:  Alert and oriented x 3 PSYCHIATRIC:  Normal affect   Signed, Garwin Brothers, MD  06/27/2022 10:40 AM    [facility]

## 2022-06-27 NOTE — Patient Instructions (Signed)
Medication Instructions:  Your physician recommends that you continue on your current medications as directed. Please refer to the Current Medication list given to you today.  *If you need a refill on your cardiac medications before your next appointment, please call your pharmacy*   Lab Work: None ordered If you have labs (blood work) drawn today and your tests are completely normal, you will receive your results only by: MyChart Message (if you have MyChart) OR A paper copy in the mail If you have any lab test that is abnormal or we need to change your treatment, we will call you to review the results.   Testing/Procedures: Your physician has requested that you have an echocardiogram. Echocardiography is a painless test that uses sound waves to create images of your heart. It provides your doctor with information about the size and shape of your heart and how well your heart's chambers and valves are working. This procedure takes approximately one hour. There are no restrictions for this procedure. Please do NOT wear cologne, perfume, aftershave, or lotions (deodorant is allowed). Please arrive 15 minutes prior to your appointment time.     Follow-Up: At [facility], you and your health needs are our priority.  As part of our continuing mission to provide you with exceptional heart care, we have created designated Provider Care Teams.  These Care Teams include your primary Cardiologist (physician) and Advanced Practice Providers (APPs -  Physician Assistants and Nurse Practitioners) who all work together to provide you with the care you need, when you need it.  We recommend signing up for the patient portal called "MyChart".  Sign up information is provided on this After Visit Summary.  MyChart is used to connect with patients for Virtual Visits (Telemedicine).  Patients are able to view lab/test results, encounter notes, upcoming appointments, etc.  Non-urgent messages can be sent to  your provider as well.   To learn more about what you can do with MyChart, go to [email or web address].    Your next appointment:   12 month(s)  The format for your next appointment:   In Person  Provider:   Rajan Revankar, MD   Other Instructions Echocardiogram An echocardiogram is a test that uses sound waves (ultrasound) to produce images of the heart. Images from an echocardiogram can provide important information about: Heart size and shape. The size and thickness and movement of your heart's walls. Heart muscle function and strength. Heart valve function or if you have stenosis. Stenosis is when the heart valves are too narrow. If blood is flowing backward through the heart valves (regurgitation). A tumor or infectious growth around the heart valves. Areas of heart muscle that are not working well because of poor blood flow or injury from a heart attack. Aneurysm detection. An aneurysm is a weak or damaged part of an artery wall. The wall bulges out from the normal force of blood pumping through the body. Tell a health care provider about: Any allergies you have. All medicines you are taking, including vitamins, herbs, eye drops, creams, and over-the-counter medicines. Any blood disorders you have. Any surgeries you have had. Any medical conditions you have. Whether you are pregnant or may be pregnant. What are the risks? Generally, this is a safe test. However, problems may occur, including an allergic reaction to dye (contrast) that may be used during the test. What happens before the test? No specific preparation is needed. You may eat and drink normally. What happens during the test? You will   take off your clothes from the waist up and put on a hospital gown. Electrodes or electrocardiogram (ECG)patches may be placed on your chest. The electrodes or patches are then connected to a device that monitors your heart rate and rhythm. You will lie down on a table for an  ultrasound exam. A gel will be applied to your chest to help sound waves pass through your skin. A handheld device, called a transducer, will be pressed against your chest and moved over your heart. The transducer produces sound waves that travel to your heart and bounce back (or "echo" back) to the transducer. These sound waves will be captured in real-time and changed into images of your heart that can be viewed on a video monitor. The images will be recorded on a computer and reviewed by your health care provider. You may be asked to change positions or hold your breath for a short time. This makes it easier to get different views or better views of your heart. In some cases, you may receive contrast through an IV in one of your veins. This can improve the quality of the pictures from your heart. The procedure may vary among health care providers and hospitals.   What can I expect after the test? You may return to your normal, everyday life, including diet, activities, and medicines, unless your health care provider tells you not to do that. Follow these instructions at home: It is up to you to get the results of your test. Ask your health care provider, or the department that is doing the test, when your results will be ready. Keep all follow-up visits. This is important. Summary An echocardiogram is a test that uses sound waves (ultrasound) to produce images of the heart. Images from an echocardiogram can provide important information about the size and shape of your heart, heart muscle function, heart valve function, and other possible heart problems. You do not need to do anything to prepare before this test. You may eat and drink normally. After the echocardiogram is completed, you may return to your normal, everyday life, unless your health care provider tells you not to do that. This information is not intended to replace advice given to you by your health care provider. Make sure you  discuss any questions you have with your health care provider. Document Revised: 10/01/2019 Document Reviewed: 10/01/2019 Elsevier Patient Education  2021 Elsevier Inc.   Important Information About Sugar        

## 2022-06-28 DIAGNOSIS — M810 Age-related osteoporosis without current pathological fracture: Secondary | ICD-10-CM | POA: Diagnosis not present

## 2022-06-28 DIAGNOSIS — E559 Vitamin D deficiency, unspecified: Secondary | ICD-10-CM | POA: Diagnosis not present

## 2022-06-28 DIAGNOSIS — E785 Hyperlipidemia, unspecified: Secondary | ICD-10-CM | POA: Diagnosis not present

## 2022-06-28 DIAGNOSIS — E1169 Type 2 diabetes mellitus with other specified complication: Secondary | ICD-10-CM | POA: Diagnosis not present

## 2022-06-28 DIAGNOSIS — F411 Generalized anxiety disorder: Secondary | ICD-10-CM | POA: Diagnosis not present

## 2022-06-28 DIAGNOSIS — M1990 Unspecified osteoarthritis, unspecified site: Secondary | ICD-10-CM | POA: Diagnosis not present

## 2022-06-28 DIAGNOSIS — H8113 Benign paroxysmal vertigo, bilateral: Secondary | ICD-10-CM | POA: Diagnosis not present

## 2022-06-28 DIAGNOSIS — I491 Atrial premature depolarization: Secondary | ICD-10-CM | POA: Diagnosis not present

## 2022-07-04 DIAGNOSIS — M9901 Segmental and somatic dysfunction of cervical region: Secondary | ICD-10-CM | POA: Diagnosis not present

## 2022-07-04 DIAGNOSIS — M4323 Fusion of spine, cervicothoracic region: Secondary | ICD-10-CM | POA: Diagnosis not present

## 2022-07-04 DIAGNOSIS — M9902 Segmental and somatic dysfunction of thoracic region: Secondary | ICD-10-CM | POA: Diagnosis not present

## 2022-07-06 DIAGNOSIS — M810 Age-related osteoporosis without current pathological fracture: Secondary | ICD-10-CM | POA: Diagnosis not present

## 2022-07-14 ENCOUNTER — Ambulatory Visit: Payer: Medicare PPO | Attending: Cardiology

## 2022-07-14 ENCOUNTER — Telehealth: Payer: Self-pay | Admitting: *Deleted

## 2022-07-14 DIAGNOSIS — R011 Cardiac murmur, unspecified: Secondary | ICD-10-CM | POA: Diagnosis not present

## 2022-07-14 LAB — ECHOCARDIOGRAM COMPLETE
P 1/2 time: 478 msec
S' Lateral: 2.9 cm

## 2022-07-14 NOTE — Telephone Encounter (Signed)
Informed pt of echo results. Pt verbalized understanding and had no further questions. 

## 2022-07-14 NOTE — Telephone Encounter (Signed)
-----   Message from Flossie Dibble, NP sent at 07/14/2022 12:28 PM EDT ----- Ms. Blakley, I am the NP that works with Dr. Tomie China. Your echocardiogram showed your heart is squeezing well, slightly stiff when it relaxes--which is not uncommon with age. You have some mild leaking of your aortic and mitral valve, which is likely what caused the murmur that Dr. Tomie China heard. There is nothing to do about this, just continue to monitor. Typically, we would not repeat this for a few years.   Best, Wallis Bamberg NP

## 2022-08-01 DIAGNOSIS — Z961 Presence of intraocular lens: Secondary | ICD-10-CM | POA: Diagnosis not present

## 2022-08-01 DIAGNOSIS — H35363 Drusen (degenerative) of macula, bilateral: Secondary | ICD-10-CM | POA: Diagnosis not present

## 2022-08-01 DIAGNOSIS — H04123 Dry eye syndrome of bilateral lacrimal glands: Secondary | ICD-10-CM | POA: Diagnosis not present

## 2022-08-08 DIAGNOSIS — M9901 Segmental and somatic dysfunction of cervical region: Secondary | ICD-10-CM | POA: Diagnosis not present

## 2022-08-08 DIAGNOSIS — M4323 Fusion of spine, cervicothoracic region: Secondary | ICD-10-CM | POA: Diagnosis not present

## 2022-08-08 DIAGNOSIS — M9902 Segmental and somatic dysfunction of thoracic region: Secondary | ICD-10-CM | POA: Diagnosis not present

## 2022-08-16 DIAGNOSIS — C4442 Squamous cell carcinoma of skin of scalp and neck: Secondary | ICD-10-CM | POA: Diagnosis not present

## 2022-08-16 DIAGNOSIS — L570 Actinic keratosis: Secondary | ICD-10-CM | POA: Diagnosis not present

## 2022-08-26 ENCOUNTER — Other Ambulatory Visit: Payer: Self-pay | Admitting: Cardiology

## 2022-08-26 NOTE — Telephone Encounter (Signed)
RX sent

## 2022-09-08 DIAGNOSIS — Z96651 Presence of right artificial knee joint: Secondary | ICD-10-CM | POA: Diagnosis not present

## 2022-09-08 DIAGNOSIS — S8001XA Contusion of right knee, initial encounter: Secondary | ICD-10-CM | POA: Diagnosis not present

## 2022-09-12 DIAGNOSIS — M4323 Fusion of spine, cervicothoracic region: Secondary | ICD-10-CM | POA: Diagnosis not present

## 2022-09-12 DIAGNOSIS — M9902 Segmental and somatic dysfunction of thoracic region: Secondary | ICD-10-CM | POA: Diagnosis not present

## 2022-09-12 DIAGNOSIS — M9901 Segmental and somatic dysfunction of cervical region: Secondary | ICD-10-CM | POA: Diagnosis not present

## 2022-09-12 DIAGNOSIS — D044 Carcinoma in situ of skin of scalp and neck: Secondary | ICD-10-CM | POA: Diagnosis not present

## 2022-10-04 DIAGNOSIS — E1169 Type 2 diabetes mellitus with other specified complication: Secondary | ICD-10-CM | POA: Diagnosis not present

## 2022-10-04 DIAGNOSIS — H8113 Benign paroxysmal vertigo, bilateral: Secondary | ICD-10-CM | POA: Diagnosis not present

## 2022-10-04 DIAGNOSIS — Z139 Encounter for screening, unspecified: Secondary | ICD-10-CM | POA: Diagnosis not present

## 2022-10-04 DIAGNOSIS — E559 Vitamin D deficiency, unspecified: Secondary | ICD-10-CM | POA: Diagnosis not present

## 2022-10-04 DIAGNOSIS — F411 Generalized anxiety disorder: Secondary | ICD-10-CM | POA: Diagnosis not present

## 2022-10-04 DIAGNOSIS — M1990 Unspecified osteoarthritis, unspecified site: Secondary | ICD-10-CM | POA: Diagnosis not present

## 2022-10-04 DIAGNOSIS — E039 Hypothyroidism, unspecified: Secondary | ICD-10-CM | POA: Diagnosis not present

## 2022-10-04 DIAGNOSIS — E785 Hyperlipidemia, unspecified: Secondary | ICD-10-CM | POA: Diagnosis not present

## 2022-10-04 DIAGNOSIS — I491 Atrial premature depolarization: Secondary | ICD-10-CM | POA: Diagnosis not present

## 2022-10-17 DIAGNOSIS — M9901 Segmental and somatic dysfunction of cervical region: Secondary | ICD-10-CM | POA: Diagnosis not present

## 2022-10-17 DIAGNOSIS — M4323 Fusion of spine, cervicothoracic region: Secondary | ICD-10-CM | POA: Diagnosis not present

## 2022-10-17 DIAGNOSIS — M9902 Segmental and somatic dysfunction of thoracic region: Secondary | ICD-10-CM | POA: Diagnosis not present

## 2022-11-14 DIAGNOSIS — M9901 Segmental and somatic dysfunction of cervical region: Secondary | ICD-10-CM | POA: Diagnosis not present

## 2022-11-14 DIAGNOSIS — M4323 Fusion of spine, cervicothoracic region: Secondary | ICD-10-CM | POA: Diagnosis not present

## 2022-11-14 DIAGNOSIS — M9902 Segmental and somatic dysfunction of thoracic region: Secondary | ICD-10-CM | POA: Diagnosis not present

## 2022-12-12 DIAGNOSIS — Z0000 Encounter for general adult medical examination without abnormal findings: Secondary | ICD-10-CM | POA: Diagnosis not present

## 2022-12-12 DIAGNOSIS — M9903 Segmental and somatic dysfunction of lumbar region: Secondary | ICD-10-CM | POA: Diagnosis not present

## 2022-12-12 DIAGNOSIS — Z9181 History of falling: Secondary | ICD-10-CM | POA: Diagnosis not present

## 2022-12-12 DIAGNOSIS — M4323 Fusion of spine, cervicothoracic region: Secondary | ICD-10-CM | POA: Diagnosis not present

## 2022-12-12 DIAGNOSIS — M9902 Segmental and somatic dysfunction of thoracic region: Secondary | ICD-10-CM | POA: Diagnosis not present

## 2022-12-12 DIAGNOSIS — M9901 Segmental and somatic dysfunction of cervical region: Secondary | ICD-10-CM | POA: Diagnosis not present

## 2023-01-09 DIAGNOSIS — M9903 Segmental and somatic dysfunction of lumbar region: Secondary | ICD-10-CM | POA: Diagnosis not present

## 2023-01-09 DIAGNOSIS — M9902 Segmental and somatic dysfunction of thoracic region: Secondary | ICD-10-CM | POA: Diagnosis not present

## 2023-01-09 DIAGNOSIS — M4323 Fusion of spine, cervicothoracic region: Secondary | ICD-10-CM | POA: Diagnosis not present

## 2023-01-09 DIAGNOSIS — M9901 Segmental and somatic dysfunction of cervical region: Secondary | ICD-10-CM | POA: Diagnosis not present

## 2023-01-10 DIAGNOSIS — H8113 Benign paroxysmal vertigo, bilateral: Secondary | ICD-10-CM | POA: Diagnosis not present

## 2023-01-10 DIAGNOSIS — F411 Generalized anxiety disorder: Secondary | ICD-10-CM | POA: Diagnosis not present

## 2023-01-10 DIAGNOSIS — E1169 Type 2 diabetes mellitus with other specified complication: Secondary | ICD-10-CM | POA: Diagnosis not present

## 2023-01-10 DIAGNOSIS — I491 Atrial premature depolarization: Secondary | ICD-10-CM | POA: Diagnosis not present

## 2023-01-10 DIAGNOSIS — E785 Hyperlipidemia, unspecified: Secondary | ICD-10-CM | POA: Diagnosis not present

## 2023-01-10 DIAGNOSIS — M810 Age-related osteoporosis without current pathological fracture: Secondary | ICD-10-CM | POA: Diagnosis not present

## 2023-01-10 DIAGNOSIS — M1990 Unspecified osteoarthritis, unspecified site: Secondary | ICD-10-CM | POA: Diagnosis not present

## 2023-01-10 DIAGNOSIS — E039 Hypothyroidism, unspecified: Secondary | ICD-10-CM | POA: Diagnosis not present

## 2023-01-17 DIAGNOSIS — M810 Age-related osteoporosis without current pathological fracture: Secondary | ICD-10-CM | POA: Diagnosis not present

## 2023-02-06 DIAGNOSIS — M9901 Segmental and somatic dysfunction of cervical region: Secondary | ICD-10-CM | POA: Diagnosis not present

## 2023-02-06 DIAGNOSIS — M9903 Segmental and somatic dysfunction of lumbar region: Secondary | ICD-10-CM | POA: Diagnosis not present

## 2023-02-06 DIAGNOSIS — M9902 Segmental and somatic dysfunction of thoracic region: Secondary | ICD-10-CM | POA: Diagnosis not present

## 2023-02-06 DIAGNOSIS — M4323 Fusion of spine, cervicothoracic region: Secondary | ICD-10-CM | POA: Diagnosis not present

## 2023-03-20 DIAGNOSIS — M9901 Segmental and somatic dysfunction of cervical region: Secondary | ICD-10-CM | POA: Diagnosis not present

## 2023-03-20 DIAGNOSIS — D044 Carcinoma in situ of skin of scalp and neck: Secondary | ICD-10-CM | POA: Diagnosis not present

## 2023-03-20 DIAGNOSIS — M9903 Segmental and somatic dysfunction of lumbar region: Secondary | ICD-10-CM | POA: Diagnosis not present

## 2023-03-20 DIAGNOSIS — L570 Actinic keratosis: Secondary | ICD-10-CM | POA: Diagnosis not present

## 2023-03-20 DIAGNOSIS — M9902 Segmental and somatic dysfunction of thoracic region: Secondary | ICD-10-CM | POA: Diagnosis not present

## 2023-03-20 DIAGNOSIS — M4323 Fusion of spine, cervicothoracic region: Secondary | ICD-10-CM | POA: Diagnosis not present

## 2023-04-17 DIAGNOSIS — M9902 Segmental and somatic dysfunction of thoracic region: Secondary | ICD-10-CM | POA: Diagnosis not present

## 2023-04-17 DIAGNOSIS — M9903 Segmental and somatic dysfunction of lumbar region: Secondary | ICD-10-CM | POA: Diagnosis not present

## 2023-04-17 DIAGNOSIS — M4323 Fusion of spine, cervicothoracic region: Secondary | ICD-10-CM | POA: Diagnosis not present

## 2023-04-17 DIAGNOSIS — M9901 Segmental and somatic dysfunction of cervical region: Secondary | ICD-10-CM | POA: Diagnosis not present

## 2023-04-25 DIAGNOSIS — M1990 Unspecified osteoarthritis, unspecified site: Secondary | ICD-10-CM | POA: Diagnosis not present

## 2023-04-25 DIAGNOSIS — M810 Age-related osteoporosis without current pathological fracture: Secondary | ICD-10-CM | POA: Diagnosis not present

## 2023-04-25 DIAGNOSIS — I491 Atrial premature depolarization: Secondary | ICD-10-CM | POA: Diagnosis not present

## 2023-04-25 DIAGNOSIS — F411 Generalized anxiety disorder: Secondary | ICD-10-CM | POA: Diagnosis not present

## 2023-04-25 DIAGNOSIS — H8113 Benign paroxysmal vertigo, bilateral: Secondary | ICD-10-CM | POA: Diagnosis not present

## 2023-04-25 DIAGNOSIS — E785 Hyperlipidemia, unspecified: Secondary | ICD-10-CM | POA: Diagnosis not present

## 2023-04-25 DIAGNOSIS — E039 Hypothyroidism, unspecified: Secondary | ICD-10-CM | POA: Diagnosis not present

## 2023-04-25 DIAGNOSIS — E559 Vitamin D deficiency, unspecified: Secondary | ICD-10-CM | POA: Diagnosis not present

## 2023-04-25 DIAGNOSIS — E1169 Type 2 diabetes mellitus with other specified complication: Secondary | ICD-10-CM | POA: Diagnosis not present

## 2023-05-15 DIAGNOSIS — M9902 Segmental and somatic dysfunction of thoracic region: Secondary | ICD-10-CM | POA: Diagnosis not present

## 2023-05-15 DIAGNOSIS — M9903 Segmental and somatic dysfunction of lumbar region: Secondary | ICD-10-CM | POA: Diagnosis not present

## 2023-05-15 DIAGNOSIS — M9901 Segmental and somatic dysfunction of cervical region: Secondary | ICD-10-CM | POA: Diagnosis not present

## 2023-05-15 DIAGNOSIS — M4323 Fusion of spine, cervicothoracic region: Secondary | ICD-10-CM | POA: Diagnosis not present

## 2023-06-12 DIAGNOSIS — M9903 Segmental and somatic dysfunction of lumbar region: Secondary | ICD-10-CM | POA: Diagnosis not present

## 2023-06-12 DIAGNOSIS — M4323 Fusion of spine, cervicothoracic region: Secondary | ICD-10-CM | POA: Diagnosis not present

## 2023-06-12 DIAGNOSIS — M9902 Segmental and somatic dysfunction of thoracic region: Secondary | ICD-10-CM | POA: Diagnosis not present

## 2023-06-12 DIAGNOSIS — M9901 Segmental and somatic dysfunction of cervical region: Secondary | ICD-10-CM | POA: Diagnosis not present

## 2023-06-14 DIAGNOSIS — Z1231 Encounter for screening mammogram for malignant neoplasm of breast: Secondary | ICD-10-CM | POA: Diagnosis not present

## 2023-06-29 ENCOUNTER — Encounter: Payer: Self-pay | Admitting: Cardiology

## 2023-06-29 ENCOUNTER — Ambulatory Visit: Attending: Cardiology | Admitting: Cardiology

## 2023-06-29 VITALS — BP 130/70 | HR 61 | Ht 68.0 in | Wt 175.6 lb

## 2023-06-29 DIAGNOSIS — I10 Essential (primary) hypertension: Secondary | ICD-10-CM | POA: Diagnosis not present

## 2023-06-29 DIAGNOSIS — E782 Mixed hyperlipidemia: Secondary | ICD-10-CM | POA: Diagnosis not present

## 2023-06-29 NOTE — Progress Notes (Signed)
 Cardiology Office Note:    Date:  06/29/2023   ID:  Jessica [name], DOB Sep 06, 1940, MRN [id number]  PCP:  Olga Berthold, NP  Cardiologist:  Nelia Balzarine, MD   Referring MD: Olga Berthold, NP    ASSESSMENT:    1. Mixed hyperlipidemia   2. Essential hypertension    PLAN:    In order of problems listed above:  Primary prevention stressed with the patient.  Importance of compliance with diet medication stressed and patient verbalized standing. Essential hypertension: Blood pressure stable and diet was emphasized.  Lifestyle modification urged.  The blood pressure readings that she has brought from home are excellent.  I reviewed the. Mixed dyslipidemia: On lipid-lowering medications followed by primary care.  Lipids evaluated from [facility] sheet and discussed with her at length.  Diet emphasized. Patient will be seen in follow-up appointment in 6 months or earlier if the patient has any concerns.    Medication Adjustments/Labs and Tests Ordered: Current medicines are reviewed at length with the patient today.  Concerns regarding medicines are outlined above.  Orders Placed This Encounter  Procedures   EKG 12-Lead   No orders of the defined types were placed in this encounter.    No chief complaint on file.    History of Present Illness:    Jessica [name] is a 83 y.o. female.  Patient has past medical history of essential hypertension and mixed dyslipidemia.  She has had history of falls in the past but does not do that anymore.  Ultimately she is much better.  No chest pain orthopnea or PND.  She ambulates with a cane and active lady takes care of activities of daily living.  At the time of my evaluation, the patient is alert awake oriented and in no distress.  Past Medical History:  Diagnosis Date   Arthritis    oa   Auditory vertigo 04/25/2019   Benign paroxysmal positional vertigo 11/04/2021   Cervical spinal stenosis 12/13/2011   Cervical spondylosis 09/11/2019    Cervicalgia of occipito-atlanto-axial region 07/29/2019   Closed nondisplaced fracture of second cervical vertebra (HCC) 06/07/2017   Controlled type 2 diabetes mellitus with diabetic nephropathy, without long-term current use of insulin (HCC) 08/21/2017   Degenerative cervical disc 10/30/2019   Displacement of lumbar intervertebral disc without myelopathy 04/11/2013   Dizziness    related seeing vestibular md for inner ear   Epistaxis 10/07/2019   Essential hypertension 11/23/2018   Hyperlipidemia 08/21/2017   Hypothyroidism    Lumbar spondylosis 10/01/2013   Lumbosacral spondylosis without myelopathy 03/29/2013   Multiple falls 04/25/2019   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   Post-traumatic headache, not intractable 04/25/2019   S/P knee replacement 01/25/2016   Sensorineural hearing loss (SNHL) of left ear 04/25/2019   Spinal stenosis of lumbar region 04/11/2013   Syncope 08/21/2017   Tremor observed on examination 04/25/2019    Past Surgical History:  Procedure Laterality Date   ANTERIOR CERVICAL DECOMP/DISCECTOMY FUSION  12/13/2011   Procedure: ANTERIOR CERVICAL DECOMPRESSION/DISCECTOMY FUSION 3 LEVELS;  Surgeon: Baruch Bosch, MD;  Location: [facility];  Service: Neurosurgery;  Laterality: N/A;  Cervical Five-Six Cervical Six-Seven Cervical Seven-Thoracic One Anterior cervical decompression/diskectomy/fusion/plating/allograft   BREAST BIOPSY Right 04/29/1998   BREAST BIOPSY Right 05/02/2019   CARPAL TUNNEL RELEASE     right   EYE SURGERY     cataract bilateral   FOOT ARTHRODESIS Left 06/25/2015   Procedure: HALLUX INTERPHALANGEAL JOINT  ARTHRODESIS;  Surgeon: Autry Legions  Rosebud Confer, MD;  Location: [facility];  Service: Orthopedics;  Laterality: Left;   GASTROCNEMIUS RECESSION Left 06/25/2015   Procedure: LEFT GASTROC RECESSION ;  Surgeon: Amada Backer, MD;  Location: [facility];  Service: Orthopedics;  Laterality: Left;   HIP  FRACTURE SURGERY Left    TENDON REPAIR Left 06/25/2015   Procedure: LEFT ANTERIOR TIBIAL TENDON RECONSTRUCTION;  EXTENSOR HALLUCIS LONGUS TRANSFER ;  Surgeon: Amada Backer, MD;  Location: [facility];  Service: Orthopedics;  Laterality: Left;   TOTAL KNEE ARTHROPLASTY Right 01/25/2016   Procedure: RIGHT TOTAL KNEE ARTHROPLASTY;  Surgeon: Claiborne Crew, MD;  Location: [facility];  Service: Orthopedics;  Laterality: Right;   TUBAL LIGATION      Current Medications: Current Meds  Medication Sig   Calcium Carb-Cholecalciferol (CALCIUM 500+D PO) Take 1 tablet by mouth daily.   citalopram  (CELEXA ) 10 MG tablet Take 10 mg by mouth at bedtime.   ergocalciferol (VITAMIN D2) 50000 UNITS capsule Take 50,000 Units by mouth as directed. 2 times a month-Friday   levothyroxine  (SYNTHROID ) 88 MCG tablet Take 88 mcg by mouth daily.   metoprolol  succinate (TOPROL -XL) 25 MG 24 hr tablet TAKE 1 TABLET(25 MG) BY MOUTH DAILY   Multiple Vitamin (MULTIVITAMIN WITH MINERALS) TABS tablet Take 7 tablets by mouth daily. [place] Multivitamin regimen   rosuvastatin (CRESTOR) 5 MG tablet Take 5 mg by mouth 4 (four) times a week.     Allergies:   Patient has no known allergies.   Social History   Socioeconomic History   Marital status: Widowed    Spouse name: Not on file   Number of children: Not on file   Years of education: Not on file   Highest education level: Not on file  Occupational History   Not on file  Tobacco Use   Smoking status: Never   Smokeless tobacco: Never  Vaping Use   Vaping status: Never Used  Substance and Sexual Activity   Alcohol use: Not Currently    Comment: rarely   Drug use: No   Sexual activity: Never  Other Topics Concern   Not on file  Social History Narrative   Not on file   Social Drivers of Health   Financial Resource Strain: Not on file  Food Insecurity: Not on file  Transportation Needs: Not on file  Physical Activity: Not on file  Stress: Not on file   Social Connections: Not on file     Family History: The patient's family history includes Dementia in her mother; Osteoporosis in her mother; Other in her father; Prostate cancer in her maternal grandfather.  ROS:   Please see the history of present illness.    All other systems reviewed and are negative.  EKGs/Labs/Other Studies Reviewed:    The following studies were reviewed today: .Aaron AasEKG Interpretation Date/Time:  Thursday Jun 29 2023 10:06:55 EDT Ventricular Rate:  61 PR Interval:  192 QRS Duration:  82 QT Interval:  400 QTC Calculation: 402 R Axis:   58  Text Interpretation: Normal sinus rhythm Low voltage QRS Borderline ECG When compared with ECG of 15-Oct-1999 10:50, No significant change was found Confirmed by Hillis Lu [phone]) on 06/29/2023 10:26:13 AM     Recent Labs: No results found for requested labs within last 365 days.  Recent Lipid Panel No results found for: "CHOL", "TRIG", "HDL", "CHOLHDL", "VLDL", "LDLCALC", "LDLDIRECT"  Physical Exam:    VS:  BP 130/70   Pulse 61   Ht 5\' 8"  (1.727  m)   Wt 175 lb 9.6 oz (79.7 kg)   SpO2 96%   BMI 26.70 kg/m     Wt Readings from Last 3 Encounters:  06/29/23 175 lb 9.6 oz (79.7 kg)  06/27/22 178 lb 3.2 oz (80.8 kg)  11/04/21 177 lb 9.6 oz (80.6 kg)     GEN: Patient is in no acute distress HEENT: Normal NECK: No JVD; No carotid bruits LYMPHATICS: No lymphadenopathy CARDIAC: Hear sounds regular, 2/6 systolic murmur at the apex. RESPIRATORY:  Clear to auscultation without rales, wheezing or rhonchi  ABDOMEN: Soft, non-tender, non-distended MUSCULOSKELETAL:  No edema; No deformity  SKIN: Warm and dry NEUROLOGIC:  Alert and oriented x 3 PSYCHIATRIC:  Normal affect   Signed, Nelia Balzarine, MD  06/29/2023 10:36 AM    [facility]

## 2023-06-29 NOTE — Patient Instructions (Signed)

## 2023-07-10 DIAGNOSIS — M4323 Fusion of spine, cervicothoracic region: Secondary | ICD-10-CM | POA: Diagnosis not present

## 2023-07-10 DIAGNOSIS — M9903 Segmental and somatic dysfunction of lumbar region: Secondary | ICD-10-CM | POA: Diagnosis not present

## 2023-07-10 DIAGNOSIS — M9902 Segmental and somatic dysfunction of thoracic region: Secondary | ICD-10-CM | POA: Diagnosis not present

## 2023-07-10 DIAGNOSIS — M9901 Segmental and somatic dysfunction of cervical region: Secondary | ICD-10-CM | POA: Diagnosis not present

## 2023-07-19 DIAGNOSIS — M810 Age-related osteoporosis without current pathological fracture: Secondary | ICD-10-CM | POA: Diagnosis not present

## 2023-08-02 DIAGNOSIS — H353131 Nonexudative age-related macular degeneration, bilateral, early dry stage: Secondary | ICD-10-CM | POA: Diagnosis not present

## 2023-08-02 DIAGNOSIS — Z961 Presence of intraocular lens: Secondary | ICD-10-CM | POA: Diagnosis not present

## 2023-08-02 DIAGNOSIS — H04123 Dry eye syndrome of bilateral lacrimal glands: Secondary | ICD-10-CM | POA: Diagnosis not present

## 2023-08-03 DIAGNOSIS — E1169 Type 2 diabetes mellitus with other specified complication: Secondary | ICD-10-CM | POA: Diagnosis not present

## 2023-08-03 DIAGNOSIS — E039 Hypothyroidism, unspecified: Secondary | ICD-10-CM | POA: Diagnosis not present

## 2023-08-03 DIAGNOSIS — M1990 Unspecified osteoarthritis, unspecified site: Secondary | ICD-10-CM | POA: Diagnosis not present

## 2023-08-03 DIAGNOSIS — H8113 Benign paroxysmal vertigo, bilateral: Secondary | ICD-10-CM | POA: Diagnosis not present

## 2023-08-03 DIAGNOSIS — F411 Generalized anxiety disorder: Secondary | ICD-10-CM | POA: Diagnosis not present

## 2023-08-03 DIAGNOSIS — E559 Vitamin D deficiency, unspecified: Secondary | ICD-10-CM | POA: Diagnosis not present

## 2023-08-03 DIAGNOSIS — E785 Hyperlipidemia, unspecified: Secondary | ICD-10-CM | POA: Diagnosis not present

## 2023-08-03 DIAGNOSIS — I491 Atrial premature depolarization: Secondary | ICD-10-CM | POA: Diagnosis not present

## 2023-08-03 DIAGNOSIS — I10 Essential (primary) hypertension: Secondary | ICD-10-CM | POA: Diagnosis not present

## 2023-08-14 DIAGNOSIS — M4323 Fusion of spine, cervicothoracic region: Secondary | ICD-10-CM | POA: Diagnosis not present

## 2023-08-14 DIAGNOSIS — M9901 Segmental and somatic dysfunction of cervical region: Secondary | ICD-10-CM | POA: Diagnosis not present

## 2023-08-14 DIAGNOSIS — M9903 Segmental and somatic dysfunction of lumbar region: Secondary | ICD-10-CM | POA: Diagnosis not present

## 2023-08-14 DIAGNOSIS — M9902 Segmental and somatic dysfunction of thoracic region: Secondary | ICD-10-CM | POA: Diagnosis not present

## 2023-09-01 ENCOUNTER — Other Ambulatory Visit: Payer: Self-pay | Admitting: Cardiology

## 2023-09-18 DIAGNOSIS — M9902 Segmental and somatic dysfunction of thoracic region: Secondary | ICD-10-CM | POA: Diagnosis not present

## 2023-09-18 DIAGNOSIS — M4323 Fusion of spine, cervicothoracic region: Secondary | ICD-10-CM | POA: Diagnosis not present

## 2023-09-18 DIAGNOSIS — M9901 Segmental and somatic dysfunction of cervical region: Secondary | ICD-10-CM | POA: Diagnosis not present

## 2023-09-18 DIAGNOSIS — M9903 Segmental and somatic dysfunction of lumbar region: Secondary | ICD-10-CM | POA: Diagnosis not present

## 2023-10-16 DIAGNOSIS — M9903 Segmental and somatic dysfunction of lumbar region: Secondary | ICD-10-CM | POA: Diagnosis not present

## 2023-10-16 DIAGNOSIS — M4323 Fusion of spine, cervicothoracic region: Secondary | ICD-10-CM | POA: Diagnosis not present

## 2023-10-16 DIAGNOSIS — M9902 Segmental and somatic dysfunction of thoracic region: Secondary | ICD-10-CM | POA: Diagnosis not present

## 2023-10-16 DIAGNOSIS — M9901 Segmental and somatic dysfunction of cervical region: Secondary | ICD-10-CM | POA: Diagnosis not present

## 2023-10-24 ENCOUNTER — Ambulatory Visit (HOSPITAL_BASED_OUTPATIENT_CLINIC_OR_DEPARTMENT_OTHER)

## 2023-11-17 DIAGNOSIS — H8113 Benign paroxysmal vertigo, bilateral: Secondary | ICD-10-CM | POA: Diagnosis not present

## 2023-11-17 DIAGNOSIS — F411 Generalized anxiety disorder: Secondary | ICD-10-CM | POA: Diagnosis not present

## 2023-11-17 DIAGNOSIS — E785 Hyperlipidemia, unspecified: Secondary | ICD-10-CM | POA: Diagnosis not present

## 2023-11-17 DIAGNOSIS — I10 Essential (primary) hypertension: Secondary | ICD-10-CM | POA: Diagnosis not present

## 2023-11-17 DIAGNOSIS — M1990 Unspecified osteoarthritis, unspecified site: Secondary | ICD-10-CM | POA: Diagnosis not present

## 2023-11-17 DIAGNOSIS — E039 Hypothyroidism, unspecified: Secondary | ICD-10-CM | POA: Diagnosis not present

## 2023-11-17 DIAGNOSIS — I491 Atrial premature depolarization: Secondary | ICD-10-CM | POA: Diagnosis not present

## 2023-11-17 DIAGNOSIS — E1169 Type 2 diabetes mellitus with other specified complication: Secondary | ICD-10-CM | POA: Diagnosis not present

## 2023-11-17 DIAGNOSIS — E559 Vitamin D deficiency, unspecified: Secondary | ICD-10-CM | POA: Diagnosis not present

## 2023-11-20 DIAGNOSIS — M9902 Segmental and somatic dysfunction of thoracic region: Secondary | ICD-10-CM | POA: Diagnosis not present

## 2023-11-20 DIAGNOSIS — M9903 Segmental and somatic dysfunction of lumbar region: Secondary | ICD-10-CM | POA: Diagnosis not present

## 2023-11-20 DIAGNOSIS — M9901 Segmental and somatic dysfunction of cervical region: Secondary | ICD-10-CM | POA: Diagnosis not present

## 2023-11-20 DIAGNOSIS — M4323 Fusion of spine, cervicothoracic region: Secondary | ICD-10-CM | POA: Diagnosis not present

## 2023-12-26 DIAGNOSIS — M9901 Segmental and somatic dysfunction of cervical region: Secondary | ICD-10-CM | POA: Diagnosis not present

## 2023-12-26 DIAGNOSIS — M9903 Segmental and somatic dysfunction of lumbar region: Secondary | ICD-10-CM | POA: Diagnosis not present

## 2023-12-26 DIAGNOSIS — M9902 Segmental and somatic dysfunction of thoracic region: Secondary | ICD-10-CM | POA: Diagnosis not present

## 2023-12-26 DIAGNOSIS — M4323 Fusion of spine, cervicothoracic region: Secondary | ICD-10-CM | POA: Diagnosis not present

## 2024-01-23 DIAGNOSIS — M9902 Segmental and somatic dysfunction of thoracic region: Secondary | ICD-10-CM | POA: Diagnosis not present

## 2024-01-23 DIAGNOSIS — M4323 Fusion of spine, cervicothoracic region: Secondary | ICD-10-CM | POA: Diagnosis not present

## 2024-01-23 DIAGNOSIS — M9903 Segmental and somatic dysfunction of lumbar region: Secondary | ICD-10-CM | POA: Diagnosis not present

## 2024-01-23 DIAGNOSIS — M9901 Segmental and somatic dysfunction of cervical region: Secondary | ICD-10-CM | POA: Diagnosis not present

## 2024-01-24 DIAGNOSIS — M810 Age-related osteoporosis without current pathological fracture: Secondary | ICD-10-CM | POA: Diagnosis not present
